# Patient Record
Sex: Male | Born: 1970 | Race: White | Hispanic: No | Marital: Married | Smoking: Never smoker
Health system: Southern US, Community
[De-identification: ages and names within clinical notes are randomized; demographics above are authoritative.]

## PROBLEM LIST (undated history)

## (undated) DIAGNOSIS — M545 Low back pain, unspecified: Secondary | ICD-10-CM

## (undated) DIAGNOSIS — Z8679 Personal history of other diseases of the circulatory system: Secondary | ICD-10-CM

## (undated) DIAGNOSIS — T7840XA Allergy, unspecified, initial encounter: Secondary | ICD-10-CM

## (undated) DIAGNOSIS — Z86010 Personal history of colonic polyps: Secondary | ICD-10-CM

## (undated) DIAGNOSIS — D649 Anemia, unspecified: Secondary | ICD-10-CM

## (undated) DIAGNOSIS — T8859XA Other complications of anesthesia, initial encounter: Secondary | ICD-10-CM

## (undated) DIAGNOSIS — T4145XA Adverse effect of unspecified anesthetic, initial encounter: Secondary | ICD-10-CM

## (undated) DIAGNOSIS — E039 Hypothyroidism, unspecified: Secondary | ICD-10-CM

## (undated) DIAGNOSIS — Z889 Allergy status to unspecified drugs, medicaments and biological substances status: Secondary | ICD-10-CM

## (undated) HISTORY — DX: Allergy, unspecified, initial encounter: T78.40XA

## (undated) HISTORY — PX: VASECTOMY: SHX75

## (undated) HISTORY — PX: COLONOSCOPY: SHX174

## (undated) HISTORY — PX: OTHER SURGICAL HISTORY: SHX169

## (undated) HISTORY — DX: Low back pain: M54.5

## (undated) HISTORY — DX: Personal history of other diseases of the circulatory system: Z86.79

## (undated) HISTORY — DX: Low back pain, unspecified: M54.50

## (undated) HISTORY — PX: ESOPHAGOGASTRODUODENOSCOPY: SHX1529

## (undated) HISTORY — PX: SPINE SURGERY: SHX786

## (undated) HISTORY — DX: Hypothyroidism, unspecified: E03.9

---
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
## 1993-12-08 HISTORY — PX: LASIK: SHX215

## 2015-03-21 ENCOUNTER — Ambulatory Visit
Payer: Federal, State, Local not specified - PPO | Attending: Physical Medicine and Rehabilitation | Admitting: Physical Therapy

## 2015-03-21 ENCOUNTER — Encounter: Payer: Self-pay | Admitting: Physical Therapy

## 2015-03-21 DIAGNOSIS — M545 Low back pain, unspecified: Secondary | ICD-10-CM

## 2015-03-21 NOTE — Patient Instructions (Signed)
Wall Lean Stretch   Keep left arm to side , With left elbow against wall, slowly stretch hips toward wall, right  other arm supporting trunk. Hold _60___ seconds. Relax. Repeat __1__ times per set. Do _1___ sets per session. Every hour Do not rotate trunk  http://orth.exer.us/102   Copyright  VHI. All rights reserved.  Lifting Principles .Maintain proper posture and head alignment. .Slide object as close as possible before lifting. .Move obstacles out of the way. .Test before lifting; ask for help if too heavy. .Tighten stomach muscles without holding breath. .Use smooth movements; do not jerk. .Use legs to do the work, and pivot with feet. .Distribute the work load symmetrically and close to the center of trunk. .Push instead of pull whenever possible.  Copyright  VHI. All rights reserved.  Low Shelf   Squat down, and bring item close to lift.   Copyright  VHI. All rights reserved.  Ask For Help   Ask for help and delegate to others when possible. Coordinate your movements when lifting together, and maintain the low back curve.   Copyright  VHI. All rights reserved.  Sleeping on Side   Place pillow between knees and feet. Use cervical support under neck and a roll around waist as needed.   Copyright  VHI. All rights reserved.  Patient able to return demonstration correctly.

## 2015-03-21 NOTE — Therapy (Signed)
[facility] [address] [place], [place], [address] Phone: [phone]   Fax:  [phone]  Physical Therapy Evaluation  Patient Details  Name: Mario Moore MRN: [id number] Date of Birth: 04-09-1971 Referring Provider:  Normajean Glasgow, MD  Encounter Date: 03/21/2015      PT End of Session - 03/21/15 1610    Visit Number 1   Date for PT Re-Evaluation 05/02/15   PT Start Time 3335   PT Stop Time 1630   PT Time Calculation (min) 60 min   Activity Tolerance Patient tolerated treatment well   Behavior During Therapy [facility] for tasks assessed/performed      History reviewed. No pertinent past medical history.  History reviewed. No pertinent past surgical history.  There were no vitals filed for this visit.  Visit Diagnosis:  Right-sided low back pain without sciatica - Plan: PT plan of care cert/re-cert      Subjective Assessment - 03/21/15 1535    Subjective Patient reports he originally hurt his back in college.  Patient most recent flare-up last friday, 03/16/2015.  Patient reports pain began after a gym work out when he pulled a bag of strawberries out.    Limitations Sitting   How long can you sit comfortably? o min   How long can you stand comfortably? 5 min   How long can you walk comfortably? 5 min   Diagnostic tests MRI 10/2013 showed 2 bulging disc.    Patient Stated Goals stronger back   Currently in Pain? Yes   Pain Score 3   5/10 worse   Pain Location Back   Pain Orientation Right   Pain Descriptors / Indicators Sharp;Dull;Constant   Pain Type Chronic pain   Pain Onset In the past 7 days   Pain Frequency Constant   Aggravating Factors  bending   Pain Relieving Factors lay on back with knees to chest   Effect of Pain on Daily Activities difficulty to work out and doing daily activities   Multiple Pain Sites No            [facility] PT Assessment - 03/21/15 0001    Assessment   Medical Diagnosis  Lower back pain   Onset Date 03/16/15   Prior Therapy None   Precautions   Precautions None   Balance Screen   Has the patient fallen in the past 6 months No   Has the patient had a decrease in activity level because of a fear of falling?  No   Is the patient reluctant to leave their home because of a fear of falling?  No   Prior Function   Level of Independence Independent with basic ADLs   Vocation Full time employment   Vocation Requirements sitting, walking, driving   Observation/Other Assessments   Focus on Therapeutic Outcomes (FOTO)  None   Posture/Postural Control   Posture/Postural Control Postural limitations   Postural Limitations Rounded Shoulders;Forward head;Decreased lumbar lordosis;Flexed trunk   AROM   Lumbar Extension decreased by 75%   Lumbar - Right Side Bend decreased by 25%   Palpation   Palpation Right ilium is anteriorly rotated, sacrum is rotated left, Decreased mobility of T11-L5                   [facility] Adult PT Treatment/Exercise - 03/21/15 0001    Modalities   Modalities Electrical Stimulation;Moist Heat   Moist Heat Therapy   Number Minutes Moist Heat 20 Minutes   Moist Heat  Location --  lumbar   Electrical Stimulation   Electrical Stimulation Location lumbar  supine   Electrical Stimulation Action IFC   Electrical Stimulation Parameters 80-_0    Electrical Stimulation Goals Pain   Manual Therapy   Manual Therapy --  MET to correct right ilium                PT Education - 03/21/15 1609    Education provided Yes   Education Details lateral shift, body mechanics   Person(s) Educated Patient   Methods Explanation;Demonstration;Tactile cues;Verbal cues;Handout   Comprehension Returned demonstration          PT Short Term Goals - 03/21/15 1614    PT SHORT TERM GOAL #1   Title understand correct body mechanics with sitting, lifting and work tasks   Time 3   Period Weeks   Status New   PT SHORT TERM GOAL #2    Title pain with daily activities decreased >/= 25%   Time 3   Period Weeks   Status New   PT SHORT TERM GOAL #3   Title full lumbar ROM due to improved lumbar mobility   Time 3   Period Weeks   Status New           PT Long Term Goals - 03/21/15 1615    PT LONG TERM GOAL #1   Title Pain with daily activities decreased >/= 75%   Time 6   Period Weeks   Status New   PT LONG TERM GOAL #2   Title return to working out due to reduced pain and increased stabiity of trunk   Time 6   Period Weeks   Status New   PT LONG TERM GOAL #3   Title sit in the car at work for extended period of time with minimal to no pain   Time 6   Period Weeks   Status New   PT LONG TERM GOAL #4   Title return to working in the yard with correct body mechanics   Time 6   Period Weeks   Status New               Plan - 03/21/15 1611    Clinical Impression Statement Patient is a 44 year old male with lumbar pain on the right, anteriorly rotated left ilium, sacrum rotated left, decreased mobility of Lumbar spine.    Pt will benefit from skilled therapeutic intervention in order to improve on the following deficits Improper body mechanics;Decreased range of motion;Increased fascial restricitons;Decreased activity tolerance;Increased muscle spasms;Pain;Decreased mobility;Decreased strength   Rehab Potential Excellent   PT Frequency 2x / week   PT Duration 6 weeks   PT Treatment/Interventions Moist Heat;Therapeutic activities;Patient/family education;Therapeutic exercise;Ultrasound;Manual techniques;Neuromuscular re-education;Electrical Stimulation;Functional mobility training;Cryotherapy   PT Next Visit Plan see if patient ready for lumbar extension, lower abdominal training, correct pelvis, lumbar mobilization   PT Home Exercise Plan lower abdominal   Recommended Other Services None   Consulted and Agree with Plan of Care Patient         Problem List There are no active problems to display  for this patient.   GRAY,CHERYL,PT 03/21/2015, 5:22 PM  [facility] [address] [place], [place], [address] Phone: [phone]   Fax:  [phone]

## 2015-03-22 ENCOUNTER — Encounter: Payer: Self-pay | Admitting: Physical Therapy

## 2015-03-22 ENCOUNTER — Ambulatory Visit: Payer: Federal, State, Local not specified - PPO | Admitting: Physical Therapy

## 2015-03-22 DIAGNOSIS — M545 Low back pain, unspecified: Secondary | ICD-10-CM

## 2015-03-22 NOTE — Patient Instructions (Signed)
Abdominal Bracing With Pelvic Floor (Hook-Lying)   With neutral spine, tighten pelvic floor and abdominals. Hold for 5 seconds Repeat _10__ times. Do _2__ times a day.   Copyright  VHI. All rights reserved.  Patient able to return demonstration correctly  With above exercise.

## 2015-03-22 NOTE — Therapy (Signed)
[facility] [address] [place], [place], [address] Phone: [phone]   Fax:  [phone]  Physical Therapy Treatment  Patient Details  Name: Mario Moore MRN: [id number] Date of Birth: November 24, 1971 Referring Provider:  Normajean Glasgow, MD  Encounter Date: 03/22/2015      PT End of Session - 03/22/15 1628    Visit Number 2   Date for PT Re-Evaluation 05/02/15   PT Start Time 0539   PT Stop Time 1630   PT Time Calculation (min) 60 min   Activity Tolerance Patient tolerated treatment well   Behavior During Therapy [facility] for tasks assessed/performed      History reviewed. No pertinent past medical history.  History reviewed. No pertinent past surgical history.  There were no vitals filed for this visit.  Visit Diagnosis:  Right-sided low back pain without sciatica      Subjective Assessment - 03/22/15 1543    Subjective I got a home TENS unit.  I try not to take the pain medication. I feel the pain may be coming to the right .    Limitations Sitting   How long can you sit comfortably? o min   How long can you stand comfortably? 5 min   How long can you walk comfortably? 5 min   Diagnostic tests MRI 10/2013 showed 2 bulging disc.    Patient Stated Goals stronger back   Currently in Pain? Yes   Pain Score 4    Pain Location Back   Pain Orientation Right   Pain Descriptors / Indicators Sharp;Dull;Constant   Pain Type Chronic pain   Pain Onset In the past 7 days   Pain Frequency Constant   Aggravating Factors  bending   Pain Relieving Factors lay on back with knees to chest   Effect of Pain on Daily Activities difficulty to work out and doing daily activities   Multiple Pain Sites No                       [facility] Adult PT Treatment/Exercise - 03/22/15 0001    Posture/Postural Control   Posture Comments PT verbally instructed patient on how to go from sit to stand with abdominal bracing, go from  supine to sitting with abdominal bracing   Lumbar Exercises: Aerobic   Stationary Bike L2 x 8 min   Lumbar Exercises: Standing   Other Standing Lumbar Exercises standing lateral shift to the left to centralize the pain   Lumbar Exercises: Supine   Ab Set 10 reps;5 seconds   AB Set Limitations Patient needed to breathe through his fist to contract lower abdominal    Moist Heat Therapy   Number Minutes Moist Heat 20 Minutes   Moist Heat Location --  lumbar   Electrical Stimulation   Electrical Stimulation Location lumbar  supine   Electrical Stimulation Action IFC   Electrical Stimulation Parameters 80-150hz    Electrical Stimulation Goals Pain   Manual Therapy   Manual Therapy Joint mobilization;Massage   Joint Mobilization side glide to L2-L3   Massage soft tissue work to right lumbar paraspinals, right quadratus, right gluteals                PT Education - 03/22/15 1628    Education provided Yes   Education Details abdominal bracing, body mechanics with sit to stand and getting out of bed.    Person(s) Educated Patient   Methods Explanation;Demonstration;Tactile cues;Verbal cues;Handout   Comprehension Verbalized understanding;Returned  demonstration          PT Short Term Goals - 03/21/15 1614    PT SHORT TERM GOAL #1   Title understand correct body mechanics with sitting, lifting and work tasks   Time 3   Period Weeks   Status New   PT SHORT TERM GOAL #2   Title pain with daily activities decreased >/= 25%   Time 3   Period Weeks   Status New   PT SHORT TERM GOAL #3   Title full lumbar ROM due to improved lumbar mobility   Time 3   Period Weeks   Status New           PT Long Term Goals - 03/21/15 1615    PT LONG TERM GOAL #1   Title Pain with daily activities decreased >/= 75%   Time 6   Period Weeks   Status New   PT LONG TERM GOAL #2   Title return to working out due to reduced pain and increased stabiity of trunk   Time 6   Period Weeks    Status New   PT LONG TERM GOAL #3   Title sit in the car at work for extended period of time with minimal to no pain   Time 6   Period Weeks   Status New   PT LONG TERM GOAL #4   Title return to working in the yard with correct body mechanics   Time 6   Period Weeks   Status New               Plan - 03/22/15 1629    Clinical Impression Statement Patient had swelling on the right lumbar region that decreased after ultrasound and soft tissue work. Patient requires verbal cues to brace his abdominal with getting of the mat.  Patient will protrude his abdomen instead of contraction lower abdominals with abdominal bracing.    Pt will benefit from skilled therapeutic intervention in order to improve on the following deficits Improper body mechanics;Decreased range of motion;Increased fascial restricitons;Decreased activity tolerance;Increased muscle spasms;Pain;Decreased mobility;Decreased strength   Rehab Potential Excellent   PT Frequency 2x / week   PT Duration 6 weeks   PT Treatment/Interventions Moist Heat;Therapeutic activities;Patient/family education;Therapeutic exercise;Ultrasound;Manual techniques;Neuromuscular re-education;Electrical Stimulation;Functional mobility training;Cryotherapy   PT Next Visit Plan lower abdominal contraction with hip in and out, continue with soft tissue work,  prone laying to increase lumbar extension   PT Home Exercise Plan lower abdominal    Recommended Other Services None   Consulted and Agree with Plan of Care Patient        Problem List There are no active problems to display for this patient.   GRAY,CHERYL,PT 03/22/2015, 4:33 PM  [facility] [address] [place], [place], [address] Phone: [phone]   Fax:  [phone]

## 2015-03-27 ENCOUNTER — Ambulatory Visit: Payer: Federal, State, Local not specified - PPO | Admitting: Physical Therapy

## 2015-03-27 ENCOUNTER — Encounter: Payer: Self-pay | Admitting: Physical Therapy

## 2015-03-27 DIAGNOSIS — M545 Low back pain, unspecified: Secondary | ICD-10-CM

## 2015-03-27 NOTE — Therapy (Signed)
[facility] [address], [place], [place], [address] Phone: [phone]   Fax:  [phone]  Physical Therapy Treatment  Patient Details  Name: Mario Moore MRN: [id number] Date of Birth: 1971-01-19 Referring Provider:  Normajean Glasgow, MD  Encounter Date: 03/27/2015      PT End of Session - 03/27/15 0942    Visit Number 3   Date for PT Re-Evaluation 05/02/15   PT Start Time 0930   PT Stop Time 0932   PT Time Calculation (min) 45 min   Activity Tolerance Patient tolerated treatment well   Behavior During Therapy [facility] for tasks assessed/performed      History reviewed. No pertinent past medical history.  History reviewed. No pertinent past surgical history.  There were no vitals filed for this visit.  Visit Diagnosis:  Right-sided low back pain without sciatica      Subjective Assessment - 03/27/15 0937    Subjective Pt reports 95% better since last PT visit   Limitations Sitting   How long can you sit comfortably? getting up from sitting causes some discomfort   How long can you stand comfortably? unlimited   Patient Stated Goals stronger back   Currently in Pain? No/denies   Multiple Pain Sites No                         [facility] Adult PT Treatment/Exercise - 03/27/15 0001    Lumbar Exercises: Aerobic   Stationary Bike L4 x 8 min   Lumbar Exercises: Standing   Other Standing Lumbar Exercises standing lateral shift to the left to centralize the pain  with prolonged hold, and dynamic 10 reps to Lt side   Lumbar Exercises: Supine   Ab Set 10 reps;5 seconds   Modalities   Modalities Electrical Stimulation;Ultrasound   Moist Heat Therapy   Number Minutes Moist Heat 20 Minutes   Moist Heat Location --  lumbar   Electrical Stimulation   Electrical Stimulation Location lumbar   Electrical Stimulation Action IFC   Electrical Stimulation Parameters 80-150Hz    Electrical Stimulation Goals Pain    Ultrasound   Ultrasound Location Lumbar  Rt lumbar   Ultrasound Parameters 100%, 1 Mhz, 1.2 W/cm x 6 min   Ultrasound Goals Edema;Pain   Manual Therapy   Manual Therapy --   Massage --                  PT Short Term Goals - 03/27/15 0955    PT SHORT TERM GOAL #1   Title understand correct body mechanics with sitting, lifting and work tasks   Time 3   Period Weeks   Status On-going   PT SHORT TERM GOAL #2   Title pain with daily activities decreased >/= 25%   Time 3   Period Weeks   Status On-going   PT SHORT TERM GOAL #3   Title full lumbar ROM due to improved lumbar mobility   Time 3   Period Weeks   Status On-going           PT Long Term Goals - 03/27/15 6712    PT LONG TERM GOAL #1   Title Pain with daily activities decreased >/= 75%   Time 6   Period Weeks   Status Achieved   PT LONG TERM GOAL #2   Title return to working out due to reduced pain and increased stabiity of trunk   Time 6   Period  Weeks   Status On-going   PT LONG TERM GOAL #3   Title sit in the car at work for extended period of time with minimal to no pain   Time 6   Period Weeks   Status On-going   PT LONG TERM GOAL #4   Title return to working in the yard with correct body mechanics   Time 6   Period Weeks   Status On-going               Plan - 03/27/15 0951    Clinical Impression Statement Pt with good demonstration of abdominal activation and reports compliance with HEP   Rehab Potential Excellent   PT Frequency 2x / week   PT Duration 6 weeks   PT Next Visit Plan lower abdominal contraction with hip in and out, continue with soft tissue work,  prone laying to increase lumbar extension   PT Home Exercise Plan continue with lower abdominal    Recommended Other Services none   Consulted and Agree with Plan of Care Patient        Problem List There are no active problems to display for this patient.   NAUMANN-HOUEGNIFIO,Fidencia Mccloud PTA 03/27/2015, 1:46  PM  [facility] [address] [place], [place], [address] Phone: [phone]   Fax:  [phone]

## 2015-03-30 ENCOUNTER — Ambulatory Visit: Payer: Federal, State, Local not specified - PPO | Admitting: Physical Therapy

## 2015-03-30 ENCOUNTER — Encounter: Payer: Self-pay | Admitting: Physical Therapy

## 2015-03-30 DIAGNOSIS — M545 Low back pain, unspecified: Secondary | ICD-10-CM

## 2015-03-30 NOTE — Patient Instructions (Signed)
Posture Tips DO: - stand tall and erect - keep chin tucked in - keep head and shoulders in alignment - check posture regularly in mirror or large window - pull head back against headrest in car seat;  Change your position often.  Sit with lumbar support. DON'T: - slouch or slump while watching TV or reading - sit, stand or lie in one position  for too long;  Sitting is especially hard on the spine so if you sit at a desk/use the computer, then stand up often!   Copyright  VHI. All rights reserved.  Posture - Standing   Good posture is important. Avoid slouching and forward head thrust. Maintain curve in low back and align ears over shoul- ders, hips over ankles.  Pull your belly button in toward your back bone.   Copyright  VHI. All rights reserved.  Posture - Sitting   Sit upright, head facing forward. Try using a roll to support lower back. Keep shoulders relaxed, and avoid rounded back. Keep hips level with knees. Avoid crossing legs for long periods.   Copyright  VHI. All rights reserved.  Lower abdominal/core stability exercises  1. Practice your breathing technique: Inhale through your nose expanding your belly and rib cage. Try not to breathe into your chest. Exhale slowly and gradually out your mouth feeling a sense of softness to your body. Practice multiple times. This can be performed unlimited.  2. Finding the lower abdominals. Laying on your back with the knees bent, place your fingers just below your belly button. Using your breathing technique from above, on your exhale gently pull the belly button away from your fingertips without tensing any other muscles. Practice this 5x. Next, as you exhale, draw belly button inwards and hold onto it...then feel as if you are pulling that muscle across your pelvis like you are tightening a belt. This can be hard to do at first so be patient and practice. Do 5-10 reps 1-3 x day. Always recognize quality over quantity; if your abdominal  muscles become tired you will notice you may tighten/contract other muscles. This is the time to take a break.   Practice this first laying on your back, then in sitting, progressing to standing and finally adding it to all your daily movements.   3. Finding your pelvic floor. Using the breathing technique above, when your exhale, this time draw your pelvic floor muscles up as if you were attempting to stop the flow of urination. Be careful NOT to tense any other muscles. This can be hard, BE PATIENT. Try to hold up to 10 seconds repeating 10x. Try 2x a day. Once you feel you are doing this well, add this contraction to exercise #2. First contracting your pelvic floor followed by lower abdominals.  4. Adding leg movements. Add the following leg movements to challenge your ability to keep your core stable:  1. Single leg drop outs: Laying on your back with knees bent feet flat. Inhale,  dropping one knee outward KEEPING YOUR PELVIS STILL. Exhale as you bring the leg back, simultaneously performing your lower abdominal contraction. Do 5-10 on each leg.  2. Marching: While keeping your pelvis still, lift the right foot a few inches, put it down then lift left foot. This will mimic a march. Start slow to establish control. Once you have control you may speed it up. Do 10-20x. You MUST keep your lower abdominlas contracted while you march. Breathe naturally   3. Single leg slides: Inhale while  you slowly slide one leg out keeping your pelvis still. Only slide your leg as far as you can keep your pelvis still. Exhale as you bring the leg back to the start, contracting the lower abdominals as you do that. Keep your upper body relaxed. Do 5-10 on each side.    Repeat __5-10__ times per set.  Do _1-2___ sessions per day. Child Pose   Sitting on knees, fold body over legs and relax head and arms on floor. Hold for 4___ breaths.  Do 3 reps.  1-2 times a day. Two pillows under your buttocks might be helpful  to really relax into the stretch.   Combination (Quadruped)  On hands and knees with towel roll between knees, slowly inhale, and then exhale. Pull navel toward spine, squeeze roll with knees, and tighten pelvic floor. Hold for __3_ seconds. Rest for _2__ seconds. Repeat _5__ times. Do __many_ times a day.   Bracing With Arm / Leg Raise (Quadruped)  On hands and knees find neutral spine. Tighten pelvic floor and abdominals and hold. Alternating, lift arm to shoulder level and opposite leg to hip level. Repeat _5__ times. Do _3__ times a day. Hold for 3 seconds. Keep deepening the core contraction.        Marland Kitchen

## 2015-03-30 NOTE — Therapy (Addendum)
[facility] [address], [place], [place], [address] Phone: [phone]   Fax:  [phone]  Physical Therapy Treatment  Patient Details  Name: Mario Moore MRN: [id number] Date of Birth: 02-Mar-1971 Referring Provider:  Normajean Glasgow, MD  Encounter Date: 03/30/2015      PT End of Session - 03/30/15 1043    Visit Number 4   Date for PT Re-Evaluation 05/02/15   PT Start Time 6962   PT Stop Time 1115   PT Time Calculation (min) 61 min   Activity Tolerance Patient tolerated treatment well   Behavior During Therapy [facility] for tasks assessed/performed      History reviewed. No pertinent past medical history.  History reviewed. No pertinent past surgical history.  There were no vitals filed for this visit.  Visit Diagnosis:  Right-sided low back pain without sciatica      Subjective Assessment - 03/30/15 1015    Subjective Continues to slowly improve, just slower than he would like.    Currently in Pain? Yes   Pain Score 1    Pain Orientation Right   Pain Descriptors / Indicators Sharp;Dull   Aggravating Factors  Bending    Pain Relieving Factors Not bending   Multiple Pain Sites No                         [facility] Adult PT Treatment/Exercise - 03/30/15 0001    Lumbar Exercises: Stretches   Active Hamstring Stretch 3 reps;20 seconds   Prone on Elbows Stretch --  2 min   Press Ups 3 reps   Quadruped Mid Back Stretch 2 reps;10 seconds   Lumbar Exercises: Supine   Ab Set --  TA contraction x10   Other Supine Lumbar Exercises TA series   Lumbar Exercises: Prone   Other Prone Lumbar Exercises TA activation 10x   Lumbar Exercises: Quadruped   Opposite Arm/Leg Raise Limitations 5x each side   Moist Heat Therapy   Number Minutes Moist Heat 15 Minutes   Moist Heat Location --  lumbar   Electrical Stimulation   Electrical Stimulation Location Lumbar   Electrical Stimulation Action IFC   Electrical  Stimulation Parameters 80-'150HZ'    Electrical Stimulation Goals Pain                PT Education - 03/30/15 1037    Education Details HEP and lumbar protective ADLS/tips to avoid compression.   Person(s) Educated Patient   Methods Explanation;Demonstration;Tactile cues;Verbal cues;Handout   Comprehension Verbalized understanding;Returned demonstration          PT Short Term Goals - 03/27/15 0955    PT SHORT TERM GOAL #1   Title understand correct body mechanics with sitting, lifting and work tasks   Time 3   Period Weeks   Status On-going   PT SHORT TERM GOAL #2   Title pain with daily activities decreased >/= 25%   Time 3   Period Weeks   Status On-going   PT SHORT TERM GOAL #3   Title full lumbar ROM due to improved lumbar mobility   Time 3   Period Weeks   Status On-going           PT Long Term Goals - 03/27/15 9528    PT LONG TERM GOAL #1   Title Pain with daily activities decreased >/= 75%   Time 6   Period Weeks   Status Achieved   PT LONG TERM GOAL #  2   Title return to working out due to reduced pain and increased stabiity of trunk   Time 6   Period Weeks   Status On-going   PT LONG TERM GOAL #3   Title sit in the car at work for extended period of time with minimal to no pain   Time 6   Period Weeks   Status On-going   PT LONG TERM GOAL #4   Title 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    Pt will benefit from skilled therapeutic intervention in order to improve on the following deficits Improper body mechanics;Decreased range of motion;Increased fascial restricitons;Decreased activity tolerance;Increased muscle spasms;Pain;Decreased mobility;Decreased strength   Rehab Potential Excellent   PT Frequency 2x / week   PT Treatment/Interventions Moist  Heat;Therapeutic activities;Patient/family education;Therapeutic exercise;Ultrasound;Manual techniques;Neuromuscular re-education;Electrical Stimulation;Functional mobility training;Cryotherapy   PT Next Visit Plan Out of town next week. Review HEP given.   Consulted and Agree with Plan of Care Patient        Problem List There are no active problems to display for this patient.   Hank Walling, PTA 03/30/2015, 10:55 AM  [facility] [address], [place], [place], [address] Phone: [phone]   Fax:  [phone]     PHYSICAL THERAPY DISCHARGE SUMMARY  Visits from Start of Care: 4  Current functional level related to goals / functional outcomes: See above. Unable to reassess patient for discharge due to not returning to physical therapy.    Remaining deficits: See above   Education / Equipment: HEP Plan:                                                    Patient goals were partially met. Patient is being discharged due to not returning since the last visit.  Thank you for referral. Earlie Counts, PT 10/24/2015 3:13 PM  ?????

## 2015-12-20 ENCOUNTER — Ambulatory Visit: Payer: Self-pay | Admitting: Surgical

## 2015-12-20 NOTE — Progress Notes (Signed)
Preoperative surgical orders have been place into the Epic hospital system for Mario Moore on 12/20/2015, 10:24 AM  by Mickel Crow for surgery on 01-09-2016.  Preop Knee Scope orders including IV Tylenol and IV Decadron as long as there are no contraindications to the above medications. Arlee Muslim, PA-C

## 2016-01-04 NOTE — Patient Instructions (Addendum)
Mario Moore  01/04/2016   Your procedure is scheduled on: 01-09-16 Wednesday  Report to [facility] Main  Entrance take [facility]  elevators to 3rd floor to  [place] at   0800 AM.  Call this number if you have problems the morning of surgery [phone]   Remember: ONLY 1 PERSON MAY GO WITH YOU TO SHORT STAY TO GET  READY MORNING OF [place].  Do not eat food or drink liquids :After Midnight.     Take these medicines the morning of surgery with A SIP OF WATER: Cetirizine(Zyrtec). DO NOT TAKE ANY DIABETIC MEDICATIONS DAY OF YOUR SURGERY                               You may not have any metal on your body including hair pins and              piercings  Do not wear jewelry, make-up, lotions, powders or perfumes, deodorant             Do not wear nail polish.  Do not shave  48 hours prior to surgery.              Men may shave face and neck.   Do not bring valuables to the hospital. [place].  Contacts, dentures or bridgework may not be worn into surgery.  Leave suitcase in the car. After surgery it may be brought to your room.     Patients discharged the day of surgery will not be allowed to drive home.  Name and phone number of your driver:"Penn" -spouse [phone] cell  Special Instructions: N/A              Please read over the following fact sheets you were given: _____________________________________________________________________             [facility] - Preparing for Surgery Before surgery, you can play an important role.  Because skin is not sterile, your skin needs to be as free of germs as possible.  You can reduce the number of germs on your skin by washing with CHG (chlorahexidine gluconate) soap before surgery.  CHG is an antiseptic cleaner which kills germs and bonds with the skin to continue killing germs even after washing. Please DO NOT use if you have an allergy to CHG or  antibacterial soaps.  If your skin becomes reddened/irritated stop using the CHG and inform your nurse when you arrive at Short Stay. Do not shave (including legs and underarms) for at least 48 hours prior to the first CHG shower.  You may shave your face/neck. Please follow these instructions carefully:  1.  Shower with CHG Soap the night before surgery and the  morning of Surgery.  2.  If you choose to wash your hair, wash your hair first as usual with your  normal  shampoo.  3.  After you shampoo, rinse your hair and body thoroughly to remove the  shampoo.                           4.  Use CHG as you would any other liquid soap.  You can apply chg directly  to  the skin and wash                       Gently with a scrungie or clean washcloth.  5.  Apply the CHG Soap to your body ONLY FROM THE NECK DOWN.   Do not use on face/ open                           Wound or open sores. Avoid contact with eyes, ears mouth and genitals (private parts).                       Wash face,  Genitals (private parts) with your normal soap.             6.  Wash thoroughly, paying special attention to the area where your surgery  will be performed.  7.  Thoroughly rinse your body with warm water from the neck down.  8.  DO NOT shower/wash with your normal soap after using and rinsing off  the CHG Soap.                9.  Pat yourself dry with a clean towel.            10.  Wear clean pajamas.            11.  Place clean sheets on your bed the night of your first shower and do not  sleep with pets. Day of Surgery : Do not apply any lotions/deodorants the morning of surgery.  Please wear clean clothes to the hospital/surgery center.  FAILURE TO FOLLOW THESE INSTRUCTIONS MAY RESULT IN THE CANCELLATION OF YOUR SURGERY PATIENT SIGNATURE_________________________________  NURSE SIGNATURE__________________________________  ________________________________________________________________________   Adam Phenix  An incentive spirometer is a tool that can help keep your lungs clear and active. This tool measures how well you are filling your lungs with each breath. Taking long deep breaths may help reverse or decrease the chance of developing breathing (pulmonary) problems (especially infection) following:  A long period of time when you are unable to move or be active. BEFORE THE PROCEDURE   If the spirometer includes an indicator to show your best effort, your nurse or respiratory therapist will set it to a desired goal.  If possible, sit up straight or lean slightly forward. Try not to slouch.  Hold the incentive spirometer in an upright position. INSTRUCTIONS FOR USE  1. Sit on the edge of your bed if possible, or sit up as far as you can in bed or on a chair. 2. Hold the incentive spirometer in an upright position. 3. Breathe out normally. 4. Place the mouthpiece in your mouth and seal your lips tightly around it. 5. Breathe in slowly and as deeply as possible, raising the piston or the ball toward the top of the column. 6. Hold your breath for 3-5 seconds or for as long as possible. Allow the piston or ball to fall to the bottom of the column. 7. Remove the mouthpiece from your mouth and breathe out normally. 8. Rest for a few seconds and repeat Steps 1 through 7 at least 10 times every 1-2 hours when you are awake. Take your time and take a few normal breaths between deep breaths. 9. The spirometer may include an indicator to show your best effort. Use the indicator as a goal to work toward during each repetition. 10. After each set  of 10 deep breaths, practice coughing to be sure your lungs are clear. If you have an incision (the cut made at the time of surgery), support your incision when coughing by placing a pillow or rolled up towels firmly against it. Once you are able to get out of bed, walk around indoors and cough well. You may stop using the incentive spirometer when  instructed by your caregiver.  RISKS AND COMPLICATIONS  Take your time so you do not get dizzy or light-headed.  If you are in pain, you may need to take or ask for pain medication before doing incentive spirometry. It is harder to take a deep breath if you are having pain. AFTER USE  Rest and breathe slowly and easily.  It can be helpful to keep track of a log of your progress. Your caregiver can provide you with a simple table to help with this. If you are using the spirometer at home, follow these instructions: [place] IF:   You are having difficultly using the spirometer.  You have trouble using the spirometer as often as instructed.  Your pain medication is not giving enough relief while using the spirometer.  You develop fever of 100.5 F (38.1 C) or higher. SEEK IMMEDIATE MEDICAL CARE IF:   You cough up bloody sputum that had not been present before.  You develop fever of 102 F (38.9 C) or greater.  You develop worsening pain at or near the incision site. MAKE SURE YOU:   Understand these instructions.  Will watch your condition.  Will get help right away if you are not doing well or get worse. Document Released: 04/06/2007 Document Revised: 02/16/2012 Document Reviewed: 06/07/2007 [facility] Patient Information 2014 [place], [place].   ________________________________________________________________________

## 2016-01-07 ENCOUNTER — Encounter (HOSPITAL_COMMUNITY)
Admission: RE | Admit: 2016-01-07 | Discharge: 2016-01-07 | Disposition: A | Discharge status: Home / Self Care | Payer: Federal, State, Local not specified - PPO | Source: Ambulatory Visit | Attending: Orthopedic Surgery | Admitting: Orthopedic Surgery

## 2016-01-07 ENCOUNTER — Encounter (HOSPITAL_COMMUNITY): Payer: Self-pay

## 2016-01-07 DIAGNOSIS — X58XXXA Exposure to other specified factors, initial encounter: Secondary | ICD-10-CM | POA: Diagnosis not present

## 2016-01-07 DIAGNOSIS — M25562 Pain in left knee: Secondary | ICD-10-CM | POA: Diagnosis present

## 2016-01-07 DIAGNOSIS — S83242A Other tear of medial meniscus, current injury, left knee, initial encounter: Secondary | ICD-10-CM | POA: Diagnosis not present

## 2016-01-07 HISTORY — DX: Allergy status to unspecified drugs, medicaments and biological substances: Z88.9

## 2016-01-07 HISTORY — DX: Anemia, unspecified: D64.9

## 2016-01-07 LAB — CBC
HCT: 45.5 % (ref 39.0–52.0)
Hemoglobin: 15.4 g/dL (ref 13.0–17.0)
MCH: 30.7 pg (ref 26.0–34.0)
MCHC: 33.8 g/dL (ref 30.0–36.0)
MCV: 90.8 fL (ref 78.0–100.0)
PLATELETS: 218 10*3/uL (ref 150–400)
RBC: 5.01 MIL/uL (ref 4.22–5.81)
RDW: 13.2 % (ref 11.5–15.5)
WBC: 3.6 10*3/uL — AB (ref 4.0–10.5)

## 2016-01-09 ENCOUNTER — Encounter (HOSPITAL_COMMUNITY): Payer: Self-pay | Admitting: *Deleted

## 2016-01-09 ENCOUNTER — Ambulatory Visit (HOSPITAL_COMMUNITY): Payer: Federal, State, Local not specified - PPO | Admitting: Registered Nurse

## 2016-01-09 ENCOUNTER — Ambulatory Visit (HOSPITAL_COMMUNITY)
Admission: RE | Admit: 2016-01-09 | Discharge: 2016-01-09 | Disposition: A | Discharge status: Home / Self Care | Payer: Federal, State, Local not specified - PPO | Source: Ambulatory Visit | Attending: Orthopedic Surgery | Admitting: Orthopedic Surgery

## 2016-01-09 ENCOUNTER — Encounter (HOSPITAL_COMMUNITY)
Admission: RE | Disposition: A | Discharge status: Home / Self Care | Payer: Self-pay | Source: Ambulatory Visit | Attending: Orthopedic Surgery

## 2016-01-09 DIAGNOSIS — X58XXXA Exposure to other specified factors, initial encounter: Secondary | ICD-10-CM | POA: Insufficient documentation

## 2016-01-09 DIAGNOSIS — S83242A Other tear of medial meniscus, current injury, left knee, initial encounter: Secondary | ICD-10-CM | POA: Insufficient documentation

## 2016-01-09 HISTORY — DX: Other complications of anesthesia, initial encounter: T88.59XA

## 2016-01-09 HISTORY — PX: KNEE ARTHROSCOPY WITH MEDIAL MENISECTOMY: SHX5651

## 2016-01-09 HISTORY — DX: Adverse effect of unspecified anesthetic, initial encounter: T41.45XA

## 2016-01-09 SURGERY — ARTHROSCOPY, KNEE, WITH MEDIAL MENISCECTOMY
Anesthesia: General | Site: Knee | Laterality: Left

## 2016-01-09 MED ORDER — GLYCOPYRROLATE 0.2 MG/ML IJ SOLN
INTRAMUSCULAR | Status: DC | PRN
  Administered 2016-01-09: 0.2 mg via INTRAVENOUS

## 2016-01-09 MED ORDER — HYDROMORPHONE HCL 1 MG/ML IJ SOLN: 0.2500 mg | INTRAMUSCULAR | Status: DC | PRN

## 2016-01-09 MED ORDER — LACTATED RINGERS IV SOLN
INTRAVENOUS | Status: DC
  Administered 2016-01-09: 1000 mL via INTRAVENOUS

## 2016-01-09 MED ORDER — METHOCARBAMOL 500 MG PO TABS: 500.0000 mg | ORAL_TABLET | Freq: Four times a day (QID) | ORAL | Status: DC

## 2016-01-09 MED ORDER — BUPIVACAINE-EPINEPHRINE 0.25% -1:200000 IJ SOLN
INTRAMUSCULAR | Status: DC | PRN
  Administered 2016-01-09: 20 mL

## 2016-01-09 MED ORDER — DEXAMETHASONE SODIUM PHOSPHATE 10 MG/ML IJ SOLN
INTRAMUSCULAR | Status: AC
  Filled 2016-01-09: qty 1

## 2016-01-09 MED ORDER — CEFAZOLIN SODIUM-DEXTROSE 2-3 GM-% IV SOLR
2.0000 g | INTRAVENOUS | Status: AC
  Administered 2016-01-09: 2 g via INTRAVENOUS

## 2016-01-09 MED ORDER — HYDROCODONE-ACETAMINOPHEN 5-325 MG PO TABS
1.0000 | ORAL_TABLET | ORAL | Status: DC | PRN
  Administered 2016-01-09: 1 via ORAL
  Filled 2016-01-09: qty 1

## 2016-01-09 MED ORDER — ONDANSETRON HCL 4 MG/2ML IJ SOLN
INTRAMUSCULAR | Status: AC
  Filled 2016-01-09: qty 2

## 2016-01-09 MED ORDER — CHLORHEXIDINE GLUCONATE 4 % EX LIQD: 60.0000 mL | Freq: Once | CUTANEOUS | Status: DC

## 2016-01-09 MED ORDER — LACTATED RINGERS IR SOLN
Status: DC | PRN
  Administered 2016-01-09 (×3): 3000 mL

## 2016-01-09 MED ORDER — SODIUM CHLORIDE 0.9 % IV SOLN: INTRAVENOUS | Status: DC

## 2016-01-09 MED ORDER — DEXAMETHASONE SODIUM PHOSPHATE 10 MG/ML IJ SOLN
INTRAMUSCULAR | Status: DC | PRN
  Administered 2016-01-09: 10 mg via INTRAVENOUS

## 2016-01-09 MED ORDER — LIDOCAINE HCL (CARDIAC) 20 MG/ML IV SOLN
INTRAVENOUS | Status: AC
  Filled 2016-01-09: qty 5

## 2016-01-09 MED ORDER — GLYCOPYRROLATE 0.2 MG/ML IJ SOLN
INTRAMUSCULAR | Status: AC
  Filled 2016-01-09: qty 1

## 2016-01-09 MED ORDER — MIDAZOLAM HCL 2 MG/2ML IJ SOLN
INTRAMUSCULAR | Status: AC
  Filled 2016-01-09: qty 2

## 2016-01-09 MED ORDER — DEXAMETHASONE SODIUM PHOSPHATE 10 MG/ML IJ SOLN: 10.0000 mg | Freq: Once | INTRAMUSCULAR | Status: DC

## 2016-01-09 MED ORDER — ONDANSETRON HCL 4 MG/2ML IJ SOLN: 4.0000 mg | Freq: Once | INTRAMUSCULAR | Status: DC | PRN

## 2016-01-09 MED ORDER — HYDROCODONE-ACETAMINOPHEN 5-325 MG PO TABS: 1.0000 | ORAL_TABLET | ORAL | Status: DC | PRN

## 2016-01-09 MED ORDER — FENTANYL CITRATE (PF) 100 MCG/2ML IJ SOLN
25.0000 ug | INTRAMUSCULAR | Status: DC | PRN
  Administered 2016-01-09 (×2): 50 ug via INTRAVENOUS

## 2016-01-09 MED ORDER — LIDOCAINE HCL (CARDIAC) 20 MG/ML IV SOLN
INTRAVENOUS | Status: DC | PRN
  Administered 2016-01-09: 100 mg via INTRAVENOUS

## 2016-01-09 MED ORDER — PROPOFOL 10 MG/ML IV BOLUS
INTRAVENOUS | Status: AC
  Filled 2016-01-09: qty 20

## 2016-01-09 MED ORDER — MIDAZOLAM HCL 5 MG/5ML IJ SOLN
INTRAMUSCULAR | Status: DC | PRN
  Administered 2016-01-09: 2 mg via INTRAVENOUS

## 2016-01-09 MED ORDER — ACETAMINOPHEN 10 MG/ML IV SOLN
1000.0000 mg | Freq: Once | INTRAVENOUS | Status: AC
  Administered 2016-01-09: 1000 mg via INTRAVENOUS
  Filled 2016-01-09: qty 100

## 2016-01-09 MED ORDER — FENTANYL CITRATE (PF) 100 MCG/2ML IJ SOLN
INTRAMUSCULAR | Status: DC
Start: 2016-01-09 — End: 2016-01-09
  Filled 2016-01-09: qty 2

## 2016-01-09 MED ORDER — PROPOFOL 10 MG/ML IV BOLUS
INTRAVENOUS | Status: DC | PRN
  Administered 2016-01-09: 200 mg via INTRAVENOUS

## 2016-01-09 MED ORDER — FENTANYL CITRATE (PF) 250 MCG/5ML IJ SOLN
INTRAMUSCULAR | Status: AC
  Filled 2016-01-09: qty 5

## 2016-01-09 MED ORDER — CEFAZOLIN SODIUM-DEXTROSE 2-3 GM-% IV SOLR
INTRAVENOUS | Status: AC
  Filled 2016-01-09: qty 50

## 2016-01-09 MED ORDER — BUPIVACAINE-EPINEPHRINE (PF) 0.25% -1:200000 IJ SOLN
INTRAMUSCULAR | Status: AC
  Filled 2016-01-09: qty 30

## 2016-01-09 MED ORDER — ONDANSETRON HCL 4 MG/2ML IJ SOLN
INTRAMUSCULAR | Status: DC | PRN
  Administered 2016-01-09: 4 mg via INTRAVENOUS

## 2016-01-09 MED ORDER — FENTANYL CITRATE (PF) 100 MCG/2ML IJ SOLN
INTRAMUSCULAR | Status: DC | PRN
  Administered 2016-01-09: 50 ug via INTRAVENOUS
  Administered 2016-01-09: 25 ug via INTRAVENOUS
  Administered 2016-01-09: 50 ug via INTRAVENOUS

## 2016-01-09 MED ORDER — ACETAMINOPHEN 10 MG/ML IV SOLN
INTRAVENOUS | Status: AC
  Filled 2016-01-09: qty 100

## 2016-01-09 SURGICAL SUPPLY — 24 items
BANDAGE ACE 6X5 VEL STRL LF (GAUZE/BANDAGES/DRESSINGS) ×2 IMPLANT
BLADE 4.2CUDA (BLADE) ×2 IMPLANT
CUFF TOURN SGL QUICK 34 (TOURNIQUET CUFF) ×1
CUFF TRNQT CYL 34X4X40X1 (TOURNIQUET CUFF) ×1 IMPLANT
DRAPE U-SHAPE 47X51 STRL (DRAPES) ×2 IMPLANT
DRSG EMULSION OIL 3X3 NADH (GAUZE/BANDAGES/DRESSINGS) ×2 IMPLANT
DRSG PAD ABDOMINAL 8X10 ST (GAUZE/BANDAGES/DRESSINGS) ×2 IMPLANT
DURAPREP 26ML APPLICATOR (WOUND CARE) ×2 IMPLANT
GAUZE SPONGE 4X4 12PLY STRL (GAUZE/BANDAGES/DRESSINGS) ×2 IMPLANT
GLOVE BIO SURGEON STRL SZ8 (GLOVE) ×2 IMPLANT
GLOVE BIOGEL PI IND STRL 8 (GLOVE) ×1 IMPLANT
GLOVE BIOGEL PI INDICATOR 8 (GLOVE) ×1
GOWN STRL REUS W/TWL LRG LVL3 (GOWN DISPOSABLE) ×4 IMPLANT
KIT BASIN OR (CUSTOM PROCEDURE TRAY) ×2 IMPLANT
MANIFOLD NEPTUNE II (INSTRUMENTS) ×2 IMPLANT
PACK ARTHROSCOPY WL (CUSTOM PROCEDURE TRAY) ×2 IMPLANT
PACK ICE MAXI GEL EZY WRAP (MISCELLANEOUS) ×2 IMPLANT
PADDING CAST COTTON 6X4 STRL (CAST SUPPLIES) ×2 IMPLANT
POSITIONER SURGICAL ARM (MISCELLANEOUS) ×2 IMPLANT
SUT ETHILON 4 0 PS 2 18 (SUTURE) ×2 IMPLANT
TOWEL OR 17X26 10 PK STRL BLUE (TOWEL DISPOSABLE) ×2 IMPLANT
TUBING ARTHRO INFLOW-ONLY STRL (TUBING) ×2 IMPLANT
WAND HAND CNTRL MULTIVAC 90 (MISCELLANEOUS) ×2 IMPLANT
WRAP KNEE MAXI GEL POST OP (GAUZE/BANDAGES/DRESSINGS) ×2 IMPLANT

## 2016-01-09 NOTE — Interval H&P Note (Signed)
History and Physical Interval Note:  01/09/2016 10:00 AM  Mario Moore  has presented today for surgery, with the diagnosis of LEFT KNEE MEDIAL MENISCAL TEAR   The various methods of treatment have been discussed with the patient and family. After consideration of risks, benefits and other options for treatment, the patient has consented to  Procedure(s): LEFT KNEE ARTHROSCOPY WITH MEDIAL DEBRIDEMENT  (Left) as a surgical intervention .  The patient's history has been reviewed, patient examined, no change in status, stable for surgery.  I have reviewed the patient's chart and labs.  Questions were answered to the patient's satisfaction.     Gearlean Alf

## 2016-01-09 NOTE — Anesthesia Postprocedure Evaluation (Signed)
Anesthesia Post Note  Patient: Jaece Heredia  Procedure(s) Performed: Procedure(s) (LRB): LEFT KNEE ARTHROSCOPY WITH MEDIAL DEBRIDEMENT  (Left)  Patient location during evaluation: PACU Anesthesia Type: General Level of consciousness: awake and alert Pain management: pain level controlled Vital Signs Assessment: post-procedure vital signs reviewed and stable Respiratory status: spontaneous breathing, nonlabored ventilation, respiratory function stable and patient connected to nasal cannula oxygen Cardiovascular status: blood pressure returned to baseline and stable Postop Assessment: no signs of nausea or vomiting Anesthetic complications: no    Last Vitals:  Filed Vitals:   01/09/16 1145 01/09/16 1206  BP: 118/72 121/88  Pulse: 62 55  Temp: 36.4 C 36.3 C  Resp: 19 16    Last Pain:  Filed Vitals:   01/09/16 1213  PainSc: 1                  Hobie Kohles JENNETTE

## 2016-01-09 NOTE — Op Note (Signed)
Preoperative diagnosis-  Left knee medial meniscal tear  Postoperative diagnosis Left- knee medial meniscal tear   Procedure- Left knee arthroscopy with medial  meniscal debridement    Surgeon- Dione Plover. Miana Politte, MD  Anesthesia-General  EBL-  Minimal  Complications- None  Condition- PACU - hemodynamically stable.  Brief clinical note- -Mario Moore is a 45 y.o.  male with a several month history of left knee pain and mechanical symptoms. Exam and history suggested medial meniscal tear confirmed by MRI. The patient presents now for arthroscopy and debridement   Procedure in detail -       After successful administration of General anesthetic, a tourmiquet is placed high on the Left  thigh and the Left lower extremity is prepped and draped in the usual sterile fashion. Time out is performed by the surgical team. Standard superomedial and inferolateral portal sites are marked and incisions made with an 11 blade. The inflow cannula is passed through the superomedial portal and camera through the inferolateral portal and inflow is initiated. Arthroscopic visualization proceeds.      The undersurface of the patella and trochlea are visualized and they are normal. The medial and lateral gutters are visualized and there are  no loose bodies. Flexion and valgus force is applied to the knee and the medial compartment is entered. A spinal needle is passed into the joint through the site marked for the inferomedial portal. A small incision is made and the dilator passed into the joint. The findings for the medial compartment are unstable tear of body and posterior horn medial meniscus with no chondral defects. . The tear is debrided to a stable base with baskets and a shaver and sealed off with the Arthrocare.It is probed and found to be stable.    The intercondylar notch is visualized and the ACL appears normal. The lateral compartment is entered and the findings are normal .      The joint is again  inspected and there are no other tears, defects or loose bodies identified. The arthroscopic equipment is then removed from the inferior portals which are closed with interrupted 4-0 nylon. 20 ml of .25% Marcaine with epinephrine are injected through the inflow cannula and the cannula is then removed and the portal closed with nylon. The incisions are cleaned and dried and a bulky sterile dressing is applied. The patient is then awakened and transported to recovery in stable condition.   01/09/2016, 11:06 AM

## 2016-01-09 NOTE — Transfer of Care (Signed)
Immediate Anesthesia Transfer of Care Note  Patient: Mario Moore  Procedure(s) Performed: Procedure(s): LEFT KNEE ARTHROSCOPY WITH MEDIAL DEBRIDEMENT  (Left)  Patient Location: PACU  Anesthesia Type:General  Level of Consciousness: awake, alert , oriented and patient cooperative  Airway & Oxygen Therapy: Patient Spontanous Breathing and Patient connected to face mask oxygen  Post-op Assessment: Report given to RN, Post -op Vital signs reviewed and stable and Patient moving all extremities  Post vital signs: Reviewed and stable  Last Vitals:  Filed Vitals:   01/09/16 0812  BP: 120/71  Pulse: 55  Temp: 36.5 C  Resp: 16    Complications: No apparent anesthesia complications

## 2016-01-09 NOTE — H&P (Signed)
  CC- Mario Moore is a 45 y.o. male who presents with left knee pain.  HPI- . Knee Pain: Patient presents with knee pain involving the  left knee. Onset of the symptoms was several months ago. Inciting event: none known. Current symptoms include giving out, pain located medially and swelling. Pain is aggravated by lateral movements, rising after sitting, running, squatting and walking.  Patient has had no prior knee problems. Evaluation to date: MRI: abnormal medial meniscal tear. Treatment to date: corticosteroid injection which was not very effective.  Past Medical History  Diagnosis Date  . H/O hereditary hemorrhagic telangiectasia (HHT)     being tx. daily  . History of seasonal allergies     tx. Cetirizine  . Anemia     due to frequency of nose bleeds- not a problem now-uses OTC iron supplement    Past Surgical History  Procedure Laterality Date  . Nasoseptolplasty    . Vasectomy    . Lasik      Prior to Admission medications   Medication Sig Start Date End Date Taking? Authorizing Provider  cetirizine (ZYRTEC) 10 MG tablet Take 10 mg by mouth daily.   Yes Historical Provider, MD  Estriol POWD Place 1 application into the nose 2 (two) times daily. Compounded at [facility] 12/19/15  Yes Historical Provider, MD  ferrous sulfate (IRON SUPPLEMENT) 325 (65 FE) MG tablet Take 325 mg by mouth 2 (two) times daily.   Yes Historical Provider, MD   KNEE EXAM antalgic gait, soft tissue tenderness over medial joint line, effusion, negative drawer sign, collateral ligaments intact  Physical Examination: General appearance - alert, well appearing, and in no distress Mental status - alert, oriented to person, place, and time Chest - clear to auscultation, no wheezes, rales or rhonchi, symmetric air entry Heart - normal rate, regular rhythm, normal S1, S2, no murmurs, rubs, clicks or gallops Abdomen - soft, nontender, nondistended, no masses or organomegaly Neurological - alert,  oriented, normal speech, no focal findings or movement disorder noted   Asessment/Plan--- Left knee medial meniscal tear- - Plan left knee arthroscopy with meniscal debridement. Procedure risks and potential comps discussed with patient who elects to proceed. Goals are decreased pain and increased function with a high likelihood of achieving both

## 2016-01-09 NOTE — Discharge Instructions (Signed)
° °Dr. Lakya Schrupp °Total Joint Specialist °[facility] °[address] °, [place] [address] °[phone] ° ° °Arthroscopic Procedure, Knee °An arthroscopic procedure can find what is wrong with your knee. °PROCEDURE °Arthroscopy is a surgical technique that allows your orthopedic surgeon to diagnose and treat your knee injury with accuracy. They will look into your knee through a small instrument. This is almost like a small (pencil sized) telescope. Because arthroscopy affects your knee less than open knee surgery, you can anticipate a more rapid recovery. Taking an active role by following your caregiver's instructions will help with rapid and complete recovery. Use crutches, rest, elevation, ice, and knee exercises as instructed. The length of recovery depends on various factors including type of injury, age, physical condition, medical conditions, and your rehabilitation. °Your knee is the joint between the large bones (femur and tibia) in your leg. Cartilage covers these bone ends which are smooth and slippery and allow your knee to bend and move smoothly. Two menisci, thick, semi-lunar shaped pads of cartilage which form a rim inside the joint, help absorb shock and stabilize your knee. Ligaments bind the bones together and support your knee joint. Muscles move the joint, help support your knee, and take stress off the joint itself. Because of this all programs and physical therapy to rehabilitate an injured or repaired knee require rebuilding and strengthening your muscles. °AFTER THE PROCEDURE °· After the procedure, you will be moved to a recovery area until most of the effects of the medication have worn off. Your caregiver will discuss the test results with you.  °· Only take over-the-counter or prescription medicines for pain, discomfort, or fever as directed by your caregiver.  °SEEK MEDICAL CARE IF:  °· You have increased bleeding from your wounds.  °· You see  redness, swelling, or have increasing pain in your wounds.  °· You have pus coming from your wound.  °· You have an oral temperature above 102° F (38.9° C).  °· You notice a bad smell coming from the wound or dressing.  °· You have severe pain with any motion of your knee.  °SEEK IMMEDIATE MEDICAL CARE IF:  °· You develop a rash.  °· You have difficulty breathing.  °· You have any allergic problems.  °FURTHER INSTRUCTIONS:  °· ICE to the affected knee every three hours for 30 minutes at a time and then as needed for pain and swelling.  Continue to use ice on the knee for pain and swelling from surgery. You may notice swelling that will progress down to the foot and ankle.  This is normal after surgery.  Elevate the leg when you are not up walking on it.   ° °DIET °You may resume your previous home diet once your are discharged from the hospital. ° °DRESSING / WOUND CARE / SHOWERING °You may start showering two days after being discharged home but do not submerge the incisions under water.  °Change dressing 48 hours after the procedure and then cover the small incisions with band aids until your follow up visit. °Change the surgical dressings daily and reapply a dry dressing each time.  ° °ACTIVITY °Walk with your walker as instructed. °Use walker as long as suggested by your caregivers. °Avoid periods of inactivity such as sitting longer than an hour when not asleep. This helps prevent blood clots.  °You may resume a sexual relationship in one month or when given the OK by your doctor.  °You may return to   work once you are cleared by your doctor.  °Do not drive a car for 6 weeks or until released by you surgeon.  °Do not drive while taking narcotics. ° °WEIGHT BEARING AS TOLERATED ° °POSTOPERATIVE CONSTIPATION PROTOCOL °Constipation - defined medically as fewer than three stools per week and severe constipation as less than one stool per week. ° °One of the most common issues patients have following surgery is  constipation.  Even if you have a regular bowel pattern at home, your normal regimen is likely to be disrupted due to multiple reasons following surgery.  Combination of anesthesia, postoperative narcotics, change in appetite and fluid intake all can affect your bowels.  In order to avoid complications following surgery, here are some recommendations in order to help you during your recovery period. ° °Colace (docusate) - Pick up an over-the-counter form of Colace or another stool softener and take twice a day as long as you are requiring postoperative pain medications.  Take with a full glass of water daily.  If you experience loose stools or diarrhea, hold the colace until you stool forms back up.  If your symptoms do not get better within 1 week or if they get worse, check with your doctor. ° °Dulcolax (bisacodyl) - Pick up over-the-counter and take as directed by the product packaging as needed to assist with the movement of your bowels.  Take with a full glass of water.  Use this product as needed if not relieved by Colace only.  ° °MiraLax (polyethylene glycol) - Pick up over-the-counter to have on hand.  MiraLax is a solution that will increase the amount of water in your bowels to assist with bowel movements.  Take as directed and can mix with a glass of water, juice, soda, coffee, or tea.  Take if you go more than two days without a movement. °Do not use MiraLax more than once per day. Call your doctor if you are still constipated or irregular after using this medication for 7 days in a row. ° °If you continue to have problems with postoperative constipation, please contact the office for further assistance and recommendations.  If you experience "the worst abdominal pain ever" or develop nausea or vomiting, please contact the office immediatly for further recommendations for treatment. ° °ITCHING ° If you experience itching with your medications, try taking only a single pain pill, or even half a pain pill  at a time.  You can also use Benadryl over the counter for itching or also to help with sleep.  ° °TED HOSE STOCKINGS °Wear the elastic stockings on both legs for three weeks following surgery during the day but you may remove then at night for sleeping. ° °MEDICATIONS °See your medication summary on the “After Visit Summary” that the nursing staff will review with you prior to discharge.  You may have some home medications which will be placed on hold until you complete the course of blood thinner medication.  It is important for you to complete the blood thinner medication as prescribed by your surgeon.  Continue your approved medications as instructed at time of discharge. °Do not drive while taking narcotics.  ° °PRECAUTIONS °If you experience chest pain or shortness of breath - call 911 immediately for transfer to the hospital emergency department.  °If you develop a fever greater that 101 F, purulent drainage from wound, increased redness or drainage from wound, foul odor from the wound/dressing, or calf pain - CONTACT YOUR SURGEON.   °                                                °  FOLLOW-UP APPOINTMENTS °Make sure you keep all of your appointments after your operation with your surgeon and caregivers. You should call the office at [phone]  and make an appointment for approximately one week after the date of your surgery or on the date instructed by your surgeon outlined in the "After Visit Summary". ° °RANGE OF MOTION AND STRENGTHENING EXERCISES  °Rehabilitation of the knee is important following a knee injury or an operation. After just a few days of immobilization, the muscles of the thigh which control the knee become weakened and shrink (atrophy). Knee exercises are designed to build up the tone and strength of the thigh muscles and to improve knee motion. Often times heat used for twenty to thirty minutes before working out will loosen up your tissues and help with improving the range of motion  but do not use heat for the first two weeks following surgery. These exercises can be done on a training (exercise) mat, on the floor, on a table or on a bed. Use what ever works the best and is most comfortable for you Knee exercises include: ° °QUAD STRENGTHENING EXERCISES °Strengthening Quadriceps Sets ° °Tighten muscles on top of thigh by pushing knees down into floor or table. °Hold for 20 seconds. Repeat 10 times. °Do 2 sessions per day. ° ° ° ° °Strengthening Terminal Knee Extension ° °With knee bent over bolster, straighten knee by tightening muscle on top of thigh. Be sure to keep bottom of knee on bolster. °Hold for 20 seconds. Repeat 10 times. °Do 2 sessions per day. ° ° °Straight Leg with Bent Knee ° °Lie on back with opposite leg bent. Keep involved knee slightly bent at knee and raise leg 4-6". Hold for 10 seconds. °Repeat 20 times per set. °Do 2 sets per session. °Do 2 sessions per day. ° °

## 2016-01-09 NOTE — Anesthesia Preprocedure Evaluation (Addendum)
Anesthesia Evaluation  Patient identified by MRN, date of birth, ID band Patient awake    Reviewed: Allergy & Precautions, NPO status , Patient's Chart, lab work & pertinent test results  Airway Mallampati: II  TM Distance: >3 FB Neck ROM: Full    Dental no notable dental hx. (+) Dental Advisory Given   Pulmonary neg pulmonary ROS,    Pulmonary exam normal breath sounds clear to auscultation       Cardiovascular negative cardio ROS Normal cardiovascular exam Rhythm:Regular Rate:Normal     Neuro/Psych negative neurological ROS  negative psych ROS   GI/Hepatic negative GI ROS, Neg liver ROS,   Endo/Other  negative endocrine ROS  Renal/GU negative Renal ROS  negative genitourinary   Musculoskeletal negative musculoskeletal ROS (+)   Abdominal   Peds negative pediatric ROS (+)  Hematology Hx of HHT, followed at [facility]   Anesthesia Other Findings   Reproductive/Obstetrics negative OB ROS                            Anesthesia Physical Anesthesia Plan  ASA: II  Anesthesia Plan: General   Post-op Pain Management:    Induction: Intravenous  Airway Management Planned: LMA  Additional Equipment:   Intra-op Plan:   Post-operative Plan: Extubation in OR  Informed Consent: I have reviewed the patients History and Physical, chart, labs and discussed the procedure including the risks, benefits and alternatives for the proposed anesthesia with the patient or authorized representative who has indicated his/her understanding and acceptance.   Dental advisory given  Plan Discussed with: CRNA  Anesthesia Plan Comments:         Anesthesia Quick Evaluation

## 2016-01-09 NOTE — Anesthesia Procedure Notes (Signed)
Procedure Name: LMA Insertion Date/Time: 01/09/2016 10:19 AM Performed by: Carleene Cooper A Pre-anesthesia Checklist: Patient identified, Timeout performed, Emergency Drugs available, Suction available and Patient being monitored Patient Re-evaluated:Patient Re-evaluated prior to inductionOxygen Delivery Method: Circle system utilized Preoxygenation: Pre-oxygenation with 100% oxygen Intubation Type: IV induction Ventilation: Mask ventilation without difficulty LMA: LMA with gastric port inserted LMA Size: 4.0 Number of attempts: 1 Placement Confirmation: positive ETCO2 and breath sounds checked- equal and bilateral Tube secured with: Tape Dental Injury: Teeth and Oropharynx as per pre-operative assessment

## 2016-05-08 DIAGNOSIS — K080 Exfoliation of teeth due to systemic causes: Secondary | ICD-10-CM | POA: Diagnosis not present

## 2016-09-09 DIAGNOSIS — M25561 Pain in right knee: Secondary | ICD-10-CM | POA: Diagnosis not present

## 2016-11-18 DIAGNOSIS — K080 Exfoliation of teeth due to systemic causes: Secondary | ICD-10-CM | POA: Diagnosis not present

## 2017-02-09 DIAGNOSIS — M9905 Segmental and somatic dysfunction of pelvic region: Secondary | ICD-10-CM | POA: Diagnosis not present

## 2017-02-09 DIAGNOSIS — M9903 Segmental and somatic dysfunction of lumbar region: Secondary | ICD-10-CM | POA: Diagnosis not present

## 2017-02-09 DIAGNOSIS — M791 Myalgia: Secondary | ICD-10-CM | POA: Diagnosis not present

## 2017-02-09 DIAGNOSIS — M62830 Muscle spasm of back: Secondary | ICD-10-CM | POA: Diagnosis not present

## 2017-02-09 DIAGNOSIS — M9902 Segmental and somatic dysfunction of thoracic region: Secondary | ICD-10-CM | POA: Diagnosis not present

## 2017-02-09 DIAGNOSIS — M5431 Sciatica, right side: Secondary | ICD-10-CM | POA: Diagnosis not present

## 2017-02-13 DIAGNOSIS — M5431 Sciatica, right side: Secondary | ICD-10-CM | POA: Diagnosis not present

## 2017-02-13 DIAGNOSIS — M9903 Segmental and somatic dysfunction of lumbar region: Secondary | ICD-10-CM | POA: Diagnosis not present

## 2017-02-13 DIAGNOSIS — M791 Myalgia: Secondary | ICD-10-CM | POA: Diagnosis not present

## 2017-02-13 DIAGNOSIS — M62830 Muscle spasm of back: Secondary | ICD-10-CM | POA: Diagnosis not present

## 2017-02-13 DIAGNOSIS — M9905 Segmental and somatic dysfunction of pelvic region: Secondary | ICD-10-CM | POA: Diagnosis not present

## 2017-02-13 DIAGNOSIS — M9902 Segmental and somatic dysfunction of thoracic region: Secondary | ICD-10-CM | POA: Diagnosis not present

## 2017-02-26 DIAGNOSIS — M9905 Segmental and somatic dysfunction of pelvic region: Secondary | ICD-10-CM | POA: Diagnosis not present

## 2017-02-26 DIAGNOSIS — M5137 Other intervertebral disc degeneration, lumbosacral region: Secondary | ICD-10-CM | POA: Diagnosis not present

## 2017-02-26 DIAGNOSIS — M9903 Segmental and somatic dysfunction of lumbar region: Secondary | ICD-10-CM | POA: Diagnosis not present

## 2017-02-26 DIAGNOSIS — M5136 Other intervertebral disc degeneration, lumbar region: Secondary | ICD-10-CM | POA: Diagnosis not present

## 2017-03-02 DIAGNOSIS — F4323 Adjustment disorder with mixed anxiety and depressed mood: Secondary | ICD-10-CM | POA: Diagnosis not present

## 2017-03-19 DIAGNOSIS — M5136 Other intervertebral disc degeneration, lumbar region: Secondary | ICD-10-CM | POA: Diagnosis not present

## 2017-03-19 DIAGNOSIS — M5137 Other intervertebral disc degeneration, lumbosacral region: Secondary | ICD-10-CM | POA: Diagnosis not present

## 2017-03-19 DIAGNOSIS — M9905 Segmental and somatic dysfunction of pelvic region: Secondary | ICD-10-CM | POA: Diagnosis not present

## 2017-03-19 DIAGNOSIS — M9903 Segmental and somatic dysfunction of lumbar region: Secondary | ICD-10-CM | POA: Diagnosis not present

## 2017-05-08 DIAGNOSIS — M5416 Radiculopathy, lumbar region: Secondary | ICD-10-CM | POA: Diagnosis not present

## 2017-05-14 DIAGNOSIS — M5136 Other intervertebral disc degeneration, lumbar region: Secondary | ICD-10-CM | POA: Diagnosis not present

## 2017-05-14 DIAGNOSIS — M9903 Segmental and somatic dysfunction of lumbar region: Secondary | ICD-10-CM | POA: Diagnosis not present

## 2017-05-14 DIAGNOSIS — M9905 Segmental and somatic dysfunction of pelvic region: Secondary | ICD-10-CM | POA: Diagnosis not present

## 2017-05-14 DIAGNOSIS — M5137 Other intervertebral disc degeneration, lumbosacral region: Secondary | ICD-10-CM | POA: Diagnosis not present

## 2017-05-21 ENCOUNTER — Ambulatory Visit (INDEPENDENT_AMBULATORY_CARE_PROVIDER_SITE_OTHER): Payer: Federal, State, Local not specified - PPO | Admitting: Family Medicine

## 2017-05-21 ENCOUNTER — Encounter: Payer: Self-pay | Admitting: Family Medicine

## 2017-05-21 VITALS — BP 117/71 | HR 50 | Temp 97.7°F | Ht 72.0 in | Wt 199.0 lb

## 2017-05-21 DIAGNOSIS — Z0000 Encounter for general adult medical examination without abnormal findings: Secondary | ICD-10-CM

## 2017-05-21 DIAGNOSIS — I780 Hereditary hemorrhagic telangiectasia: Secondary | ICD-10-CM | POA: Diagnosis not present

## 2017-05-21 DIAGNOSIS — M5126 Other intervertebral disc displacement, lumbar region: Secondary | ICD-10-CM | POA: Insufficient documentation

## 2017-05-21 DIAGNOSIS — R748 Abnormal levels of other serum enzymes: Secondary | ICD-10-CM

## 2017-05-21 LAB — LIPID PANEL
CHOL/HDL RATIO: 2
Cholesterol: 128 mg/dL (ref 0–200)
HDL: 58.9 mg/dL (ref >39.00)
LDL CALC: 57 mg/dL (ref 0–99)
NonHDL: 69.1
TRIGLYCERIDES: 59 mg/dL (ref 0.0–149.0)
VLDL: 11.8 mg/dL (ref 0.0–40.0)

## 2017-05-21 LAB — BASIC METABOLIC PANEL
BUN: 14 mg/dL (ref 6–23)
CALCIUM: 9.6 mg/dL (ref 8.4–10.5)
CO2: 31 mEq/L (ref 19–32)
CREATININE: 0.99 mg/dL (ref 0.40–1.50)
Chloride: 104 mEq/L (ref 96–112)
GFR: 85.76 mL/min (ref >60.00)
Glucose, Bld: 96 mg/dL (ref 70–99)
POTASSIUM: 3.8 meq/L (ref 3.5–5.1)
Sodium: 140 mEq/L (ref 135–145)

## 2017-05-21 LAB — HEPATIC FUNCTION PANEL
ALBUMIN: 4.4 g/dL (ref 3.5–5.2)
ALT: 100 U/L — ABNORMAL HIGH (ref 0–53)
AST: 48 U/L — ABNORMAL HIGH (ref 0–37)
Alkaline Phosphatase: 123 U/L — ABNORMAL HIGH (ref 39–117)
Bilirubin, Direct: 0.2 mg/dL (ref 0.0–0.3)
Total Bilirubin: 0.8 mg/dL (ref 0.2–1.2)
Total Protein: 6.8 g/dL (ref 6.0–8.3)

## 2017-05-21 LAB — CBC WITH DIFFERENTIAL/PLATELET
BASOS ABS: 0 10*3/uL (ref 0.0–0.1)
Basophils Relative: 1.4 % (ref 0.0–3.0)
EOS ABS: 0.1 10*3/uL (ref 0.0–0.7)
Eosinophils Relative: 3.8 % (ref 0.0–5.0)
HEMATOCRIT: 44.7 % (ref 39.0–52.0)
HEMOGLOBIN: 15.1 g/dL (ref 13.0–17.0)
Lymphocytes Relative: 25.2 % (ref 12.0–46.0)
Lymphs Abs: 0.8 10*3/uL (ref 0.7–4.0)
MCHC: 33.9 g/dL (ref 30.0–36.0)
MCV: 90.5 fl (ref 78.0–100.0)
MONO ABS: 0.4 10*3/uL (ref 0.1–1.0)
Monocytes Relative: 13 % — ABNORMAL HIGH (ref 3.0–12.0)
Neutro Abs: 1.8 10*3/uL (ref 1.4–7.7)
Neutrophils Relative %: 56.6 % (ref 43.0–77.0)
PLATELETS: 178 10*3/uL (ref 150.0–400.0)
RBC: 4.93 Mil/uL (ref 4.22–5.81)
RDW: 14.4 % (ref 11.5–15.5)
WBC: 3.1 10*3/uL — AB (ref 4.0–10.5)

## 2017-05-21 LAB — POC URINALSYSI DIPSTICK (AUTOMATED)
Bilirubin, UA: NEGATIVE
Glucose, UA: NEGATIVE
Ketones, UA: NEGATIVE
Leukocytes, UA: NEGATIVE
NITRITE UA: NEGATIVE
PH UA: 6 (ref 5.0–8.0)
Protein, UA: NEGATIVE
RBC UA: NEGATIVE
UROBILINOGEN UA: 0.2 U/dL

## 2017-05-21 LAB — TSH: TSH: 6.24 u[IU]/mL — AB (ref 0.35–4.50)

## 2017-05-21 LAB — PSA: PSA: 0.51 ng/mL (ref 0.10–4.00)

## 2017-05-21 NOTE — Progress Notes (Signed)
   Subjective:    Patient ID: Mario Moore, male    DOB: Apr 24, 1971, 46 y.o.   MRN: [id number]  HPI 46 yr old male to establish with [place] after transferring from [facility]. He is also for a well exam. He feels fine and has no complaints. He has Hereditary Hemorrhagic Telangectasia (HHT) and he is followed by [facility]. This has only manifested with frequent nosebleeds, but these have been well controlled by using topical Estriol powder in the nostrils. He has intermittent low back pain from a herniated disc, but he gets acupuncture treatments regularly and these have been very effective for him.    Review of Systems  Constitutional: Negative.   HENT: Negative.   Eyes: Negative.   Respiratory: Negative.   Cardiovascular: Negative.   Gastrointestinal: Negative.   Genitourinary: Negative.   Musculoskeletal: Positive for back pain. Negative for arthralgias, gait problem, joint swelling, myalgias, neck pain and neck stiffness.  Skin: Negative.   Neurological: Negative.   Psychiatric/Behavioral: Negative.        Objective:   Physical Exam  Constitutional: He is oriented to person, place, and time. He appears well-developed and well-nourished. No distress.  HENT:  Head: Normocephalic and atraumatic.  Right Ear: External ear normal.  Left Ear: External ear normal.  Nose: Nose normal.  Mouth/Throat: Oropharynx is clear and moist. No oropharyngeal exudate.  Eyes: Conjunctivae and EOM are normal. Pupils are equal, round, and reactive to light. Right eye exhibits no discharge. Left eye exhibits no discharge. No scleral icterus.  Neck: Neck supple. No JVD present. No tracheal deviation present. No thyromegaly present.  Cardiovascular: Normal rate, regular rhythm, normal heart sounds and intact distal pulses.  Exam reveals no gallop and no friction rub.   No murmur heard. Pulmonary/Chest: Effort normal and breath sounds normal. No respiratory distress. He has no  wheezes. He has no rales. He exhibits no tenderness.  Abdominal: Soft. Bowel sounds are normal. He exhibits no distension and no mass. There is no tenderness. There is no rebound and no guarding.  Genitourinary: Rectum normal, prostate normal and penis normal. Rectal exam shows guaiac negative stool. No penile tenderness.  Musculoskeletal: Normal range of motion. He exhibits no edema or tenderness.  Lymphadenopathy:    He has no cervical adenopathy.  Neurological: He is alert and oriented to person, place, and time. He has normal reflexes. No cranial nerve deficit. He exhibits normal muscle tone. Coordination normal.  Skin: Skin is warm and dry. No rash noted. He is not diaphoretic. No erythema. No pallor.  Psychiatric: He has a normal mood and affect. His behavior is normal. Judgment and thought content normal.          Assessment & Plan:  Well exam. We discussed diet and exercise. Get fasting labs. We will refer him back to the [facility] clinic for the [place].  Alysia Penna, MD

## 2017-05-21 NOTE — Patient Instructions (Signed)
WE NOW OFFER   [facility] FAST TRACK!!!  SAME DAY Appointments for ACUTE CARE  Such as: Sprains, Injuries, cuts, abrasions, rashes, muscle pain, joint pain, back pain Colds, flu, sore throats, headache, allergies, cough, fever  Ear pain, sinus and eye infections Abdominal pain, nausea, vomiting, diarrhea, upset stomach Animal/insect bites  3 Easy Ways to Schedule: Walk-In Scheduling Call in scheduling Mychart Sign-up: [email or web address]         

## 2017-05-22 ENCOUNTER — Encounter: Payer: Self-pay | Admitting: Internal Medicine

## 2017-05-22 ENCOUNTER — Other Ambulatory Visit: Payer: Self-pay | Admitting: Family Medicine

## 2017-05-22 MED ORDER — LEVOTHYROXINE SODIUM 75 MCG PO TABS: 75.0000 ug | ORAL_TABLET | Freq: Every day | ORAL | 1 refills | Status: DC

## 2017-05-22 NOTE — Addendum Note (Signed)
Addended by: Alysia Penna A on: 05/22/2017 01:03 PM   Modules accepted: Orders

## 2017-05-27 DIAGNOSIS — K080 Exfoliation of teeth due to systemic causes: Secondary | ICD-10-CM | POA: Diagnosis not present

## 2017-07-06 ENCOUNTER — Encounter: Payer: Self-pay | Admitting: Internal Medicine

## 2017-07-06 ENCOUNTER — Ambulatory Visit (INDEPENDENT_AMBULATORY_CARE_PROVIDER_SITE_OTHER): Payer: Federal, State, Local not specified - PPO | Admitting: Internal Medicine

## 2017-07-06 ENCOUNTER — Other Ambulatory Visit (INDEPENDENT_AMBULATORY_CARE_PROVIDER_SITE_OTHER): Payer: Federal, State, Local not specified - PPO

## 2017-07-06 VITALS — BP 96/60 | HR 56 | Ht 71.25 in | Wt 198.4 lb

## 2017-07-06 DIAGNOSIS — R748 Abnormal levels of other serum enzymes: Secondary | ICD-10-CM

## 2017-07-06 LAB — HEPATIC FUNCTION PANEL
ALT: 112 U/L — ABNORMAL HIGH (ref 0–53)
AST: 65 U/L — ABNORMAL HIGH (ref 0–37)
Albumin: 4.3 g/dL (ref 3.5–5.2)
Alkaline Phosphatase: 135 U/L — ABNORMAL HIGH (ref 39–117)
BILIRUBIN DIRECT: 0.1 mg/dL (ref 0.0–0.3)
BILIRUBIN TOTAL: 0.6 mg/dL (ref 0.2–1.2)
Total Protein: 7.2 g/dL (ref 6.0–8.3)

## 2017-07-06 LAB — IGA: IGA: 514 mg/dL — AB (ref 68–378)

## 2017-07-06 NOTE — Patient Instructions (Signed)
Your physician has requested that you go to the basement for lab work before leaving today.   You have been scheduled for an abdominal ultrasound at [facility] at [address]. On 07/14/17 at 9:45AM. Please arrive 20 minutes prior to your appointment for registration. Make sure and have nothing to eat after midnight. Should you need to reschedule your appointment, please contact radiology at [phone] This test typically takes about 30 minutes to perform.    I appreciate the opportunity to care for you. Silvano Rusk, MD, [facility]

## 2017-07-06 NOTE — Progress Notes (Signed)
Mario Moore 46 y.o. 02/12/1971 [id number] Referred by: Mario Morale, MD  Assessment & Plan:   Encounter Diagnoses  Name Primary?  . Abnormal transaminases Yes  . Abnormal alkaline phosphatase test    Chronic recurrent mild elevation of transaminases and alkaline phosphatase since the 1990s. Previous testing has shown negative hepatitis C. He is immunized against hepatitis B. Positive antibody. Cause of this is not entirely clear. I don't think it's related to his hereditary hemorrhagic telangiectasia. You can get AVMs in the liver but I can find no association of abnormal liver chemistries and HHT. No signs of advanced liver disease on physical exam or other lab testing. He does not suffer from myopathy-like symptoms or myositis symptoms. He is not on a statin. Trivial elevation and TSH of probably no relationship. Alcohol and medications do not seem implicated either. I don't quite understand why he had a high iron saturation in 2015 but a ferritin of 30. He has never had transfusions. May need to discuss with hematology. Probably unrelated also.  Likely benign innocent changes. Will evaluate with the following testing:  [place] RUQ LFT, alpha-1-antitrypsin level, mitochondrial Abs, ANA, anti-smooth mm Ab and TTG Ab and IgA level  I appreciate the opportunity to care for this patient. CC: Mario Morale, MD   Subjective:   Chief Complaint:Abnormal liver chemistries  HPI The patient is a very pleasant well developed well-nourished 46 year old married white man with a long history of abnormal liver chemistries, he was recently evaluated by Dr. Sarajane Moore with following lab test results.  Lab Results  Component Value Date   ALT 100 (H) 05/21/2017   AST 48 (H) 05/21/2017   ALKPHOS 123 (H) 05/21/2017   BILITOT 0.8 05/21/2017   This is not a new problem. The patient has records to go back to the late 1990s showing mild elevation of ALT or AST and GGT and sometimes alkaline  phosphatase. In 1999 he saw a gastroenterologist in [place] and had abdominal ultrasound, it was normal. He also had negative hepatitis C testing hepatitis A testing and showed that he was positive for hepatitis B surface antibody reflective of prior immunization. He drinks a few alcoholic beverages on the weekends only. He denies any significant myalgias or muscle aches or heavy fast food eating, as no family history of liver disease. He does have hereditary hemorrhagic telangiectasia. That is under good control with the use of a low-dose estrogen nasal gel. Epistaxis has resolved and anemia has resolved using that. He does not even take iron supplementation anymore. His GI review of systems and symptoms is entirely negative. Allergies  Allergen Reactions  . Ibuprofen Other (See Comments)    HHT  . Aspirin     Thins out blood, has HHT   Current Meds  Medication Sig  . MELATONIN PO Take 1-3 mg by mouth as needed.   Past Medical History:  Diagnosis Date  . Anemia    due to frequency of nose bleeds- not a problem now-uses OTC iron supplement  . Complication of anesthesia    slow to wake up after surgery  . History of seasonal allergies    tx. Cetirizine  . Hypothyroidism   . Low back pain    gets acupuncture from Dr. Lynnda Moore at [facility]  . Personal history of hereditary hemorrhagic telangiectasia (HHT)    sees Dr. Marchelle Moore at [facility]    Past Surgical History:  Procedure Laterality Date  . KNEE ARTHROSCOPY WITH  MEDIAL MENISECTOMY Left 01/09/2016   Procedure: LEFT KNEE ARTHROSCOPY WITH MEDIAL DEBRIDEMENT ;  Surgeon: Gaynelle Arabian, MD;  Location: [facility];  Service: Orthopedics;  Laterality: Left;  . LASIK Bilateral 1995  . nasoseptolplasty    . VASECTOMY     Social History   Social History  . Marital status: Married    Spouse name: N/A  . Number of children: 2   Occupational History  . [employer]    Social History Main Topics  . Smoking  status: Never Smoker  . Smokeless tobacco: Never Used  . Alcohol use Yes     Comment: beer on weekends -occ.  . Drug use: No  . 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   Review of Systems Nasal estrogen gel has stopped epistaxis  Slight elevation of TSH - recently started on TSH  Objective:   Physical Exam @BP  96/60 (BP Location: Left Arm, Patient Position: Sitting, Cuff Size: Normal)   Pulse (!) 56   Ht 5' 11.25" (1.81 m) Comment: height measured without shoes  Wt 198 lb 6 oz (90 kg)   BMI 27.47 kg/m @  General:  Well-developed, well-nourished and in no acute distress Eyes:  anicteric.  Lungs: Clear to auscultation bilaterally. Heart:  S1S2, no rubs, murmurs, gallops. Abdomen:  soft, non-tender, no hepatosplenomegaly, hernia, or mass and BS+. Liver percusses to about 8-9 cm. Liver edge is not palpable. Extremities:   no edema, cyanosis or clubbing Skin   no rash. Multipe cherry nevi vs telangiectasia Neuro:  A&O x 3.  Psych:  appropriate mood and  Affect.   Data Reviewed: Ferritin - Final result (03/14/2014 12:01 PM) Ferritin - Final result (03/14/2014 12:01 PM)  Component Value Range  Ferritin 30 27-377 ng/mL   Ferritin - Final result (03/14/2014 12:01 PM)  Specimen  Other    Iron Panel - Final result (03/14/2014 12:01 PM) Iron Panel - Final result (03/14/2014 12:01 PM)  Component Value Range  Iron 372 (H) 35-165 ug/dL  Transferrin 325 200-380 mg/dL  TIBC 410 252-479 ug/dL  Iron Saturation (%) 91 (H) 20-50 %

## 2017-07-07 LAB — TISSUE TRANSGLUTAMINASE, IGA: Tissue Transglutaminase Ab, IgA: 1 U/mL (ref <4)

## 2017-07-07 LAB — MITOCHONDRIAL ANTIBODIES: Mitochondrial M2 Ab, IgG: <=20 Units (ref <=20.0)

## 2017-07-07 LAB — ANA: Anti Nuclear Antibody(ANA): NEGATIVE

## 2017-07-08 LAB — ANTI-SMOOTH MUSCLE ANTIBODY, IGG: Smooth Muscle Ab: <20 U (ref <20)

## 2017-07-08 LAB — ALPHA-1-ANTITRYPSIN: A-1 Antitrypsin, Ser: 139 mg/dL (ref 83–199)

## 2017-07-10 ENCOUNTER — Encounter: Payer: Self-pay | Admitting: Internal Medicine

## 2017-07-10 ENCOUNTER — Other Ambulatory Visit: Payer: Self-pay | Admitting: Internal Medicine

## 2017-07-10 DIAGNOSIS — R790 Abnormal level of blood mineral: Secondary | ICD-10-CM

## 2017-07-10 DIAGNOSIS — R748 Abnormal levels of other serum enzymes: Secondary | ICD-10-CM

## 2017-07-10 NOTE — Progress Notes (Signed)
My Chart note about labs

## 2017-07-13 ENCOUNTER — Encounter: Payer: Self-pay | Admitting: Internal Medicine

## 2017-07-14 ENCOUNTER — Ambulatory Visit
Admission: RE | Admit: 2017-07-14 | Discharge: 2017-07-14 | Disposition: A | Discharge status: Home / Self Care | Payer: Federal, State, Local not specified - PPO | Source: Ambulatory Visit | Attending: Internal Medicine | Admitting: Internal Medicine

## 2017-07-14 ENCOUNTER — Other Ambulatory Visit (INDEPENDENT_AMBULATORY_CARE_PROVIDER_SITE_OTHER): Payer: Federal, State, Local not specified - PPO

## 2017-07-14 DIAGNOSIS — R748 Abnormal levels of other serum enzymes: Secondary | ICD-10-CM

## 2017-07-14 DIAGNOSIS — R7989 Other specified abnormal findings of blood chemistry: Secondary | ICD-10-CM | POA: Diagnosis not present

## 2017-07-14 DIAGNOSIS — R790 Abnormal level of blood mineral: Secondary | ICD-10-CM

## 2017-07-14 LAB — IBC PANEL
Iron: 179 ug/dL — ABNORMAL HIGH (ref 42–165)
Saturation Ratios: 39.7 % (ref 20.0–50.0)
Transferrin: 322 mg/dL (ref 212.0–360.0)

## 2017-07-14 LAB — FERRITIN: Ferritin: 43.4 ng/mL (ref 22.0–322.0)

## 2017-07-17 NOTE — Progress Notes (Signed)
[place] ok My chart message

## 2017-07-17 NOTE — Progress Notes (Signed)
Iron studies ok overall Waiting on hemochromatosis gene test

## 2017-07-18 LAB — HEMOCHROMATOSIS DNA-PCR(C282Y,H63D)

## 2017-07-19 ENCOUNTER — Encounter: Payer: Self-pay | Admitting: Internal Medicine

## 2017-07-23 NOTE — Progress Notes (Signed)
Patient aware through my chart discussion

## 2017-08-10 ENCOUNTER — Encounter: Payer: Self-pay | Admitting: Family Medicine

## 2017-08-10 DIAGNOSIS — E039 Hypothyroidism, unspecified: Secondary | ICD-10-CM

## 2017-08-11 DIAGNOSIS — E039 Hypothyroidism, unspecified: Secondary | ICD-10-CM | POA: Insufficient documentation

## 2017-08-11 NOTE — Telephone Encounter (Signed)
I ordered a TSH. He needs to make a lab appt soon

## 2017-08-12 ENCOUNTER — Other Ambulatory Visit (INDEPENDENT_AMBULATORY_CARE_PROVIDER_SITE_OTHER): Payer: Federal, State, Local not specified - PPO

## 2017-08-12 DIAGNOSIS — E039 Hypothyroidism, unspecified: Secondary | ICD-10-CM | POA: Diagnosis not present

## 2017-08-12 LAB — TSH: TSH: 2.15 u[IU]/mL (ref 0.35–4.50)

## 2017-08-18 NOTE — Telephone Encounter (Signed)
He should actually have another 90 day refill available at his pharmacy. He should call them to refill it

## 2017-08-24 ENCOUNTER — Encounter: Payer: Self-pay | Admitting: Internal Medicine

## 2017-08-27 ENCOUNTER — Encounter: Payer: Self-pay | Admitting: Family Medicine

## 2017-08-27 ENCOUNTER — Encounter: Payer: Self-pay | Admitting: Internal Medicine

## 2017-09-08 ENCOUNTER — Telehealth: Payer: Self-pay | Admitting: Internal Medicine

## 2017-09-08 ENCOUNTER — Encounter: Payer: Self-pay | Admitting: Internal Medicine

## 2017-09-09 ENCOUNTER — Other Ambulatory Visit: Payer: Self-pay

## 2017-09-09 ENCOUNTER — Telehealth: Payer: Self-pay

## 2017-09-09 DIAGNOSIS — R7989 Other specified abnormal findings of blood chemistry: Secondary | ICD-10-CM

## 2017-09-09 DIAGNOSIS — R945 Abnormal results of liver function studies: Secondary | ICD-10-CM

## 2017-09-09 NOTE — Telephone Encounter (Signed)
Order placed for US biopsy of liver, spoke to Bartolo in scheduling, order is in work que. The radiology department will review information and contact patient to schedule. I called and lvm for patient to expect a call from hospital to schedule. Also noted on order is HHT possible liver AVM's.

## 2017-09-09 NOTE — Telephone Encounter (Signed)
-----   Message from Gatha Mayer, MD sent at 09/09/2017  8:35 AM EDT ----- Regarding: needs US-guded liver bx This man needs US-guided liver bx arranged with IR  Dx: abnormal LFT's  Please note in the order info that he has HHT (hereditary hemorrhagictelangiectasia) and could have liver AVM's but so far not seen on imaging

## 2017-09-17 ENCOUNTER — Other Ambulatory Visit: Payer: Self-pay

## 2017-09-17 ENCOUNTER — Telehealth: Payer: Self-pay

## 2017-09-17 ENCOUNTER — Telehealth: Payer: Self-pay | Admitting: Internal Medicine

## 2017-09-17 ENCOUNTER — Encounter: Payer: Self-pay | Admitting: Internal Medicine

## 2017-09-17 DIAGNOSIS — R945 Abnormal results of liver function studies: Principal | ICD-10-CM

## 2017-09-17 DIAGNOSIS — K080 Exfoliation of teeth due to systemic causes: Secondary | ICD-10-CM | POA: Diagnosis not present

## 2017-09-17 DIAGNOSIS — R7989 Other specified abnormal findings of blood chemistry: Secondary | ICD-10-CM

## 2017-09-17 NOTE — Telephone Encounter (Signed)
MRI has been scheduled for 09/22/17 4:00 at [facility] Left message for patient to call back  See message below    FW: Planned liver biopsy  Received: Today  Message Contents  Gatha Mayer, MD sent to Marlon Pel, RN        I sent the patient a message that we would do MRI   Please order the MRI without and with Eovist contrast re: abnormal LFT's, evaluate for AVM's in patient with hereditary hemorrhagic telangiectasia   We may need to reschedule the liver biosy   Previous Messages

## 2017-09-17 NOTE — Telephone Encounter (Signed)
See other phone note for details. 

## 2017-09-18 ENCOUNTER — Telehealth: Payer: Self-pay

## 2017-09-18 ENCOUNTER — Other Ambulatory Visit: Payer: Self-pay

## 2017-09-18 DIAGNOSIS — I780 Hereditary hemorrhagic telangiectasia: Secondary | ICD-10-CM

## 2017-09-18 NOTE — Telephone Encounter (Signed)
I was able to add on MR brain w and wo contrast, spoke to Kimball in scheduling. Will send patient a message through [facility] since phones are down.

## 2017-09-18 NOTE — Telephone Encounter (Signed)
I left a very detailed message for the patient and sent a MyChart message.  The phone lines are not working in the building today due to [employer].

## 2017-09-18 NOTE — Telephone Encounter (Signed)
THANKS

## 2017-09-18 NOTE — Telephone Encounter (Signed)
I spoke with the patient and explained the situation.  He will go for MRI on Tues and will wait on biopsy for results.  All questions answered.

## 2017-09-18 NOTE — Telephone Encounter (Signed)
-----   Message from Gatha Mayer, MD sent at 09/18/2017 12:32 PM EDT ----- Regarding: ? add brain MR Given his HHT he can have brain AVM's  If we can please try to add MR brain without and with contrast looking for AVM of brain  Dx would be hereditary hemorrhagic telangiectasia - if its not possible to ad to the MR on this day let me and him know

## 2017-09-22 ENCOUNTER — Ambulatory Visit (HOSPITAL_COMMUNITY)
Admission: RE | Admit: 2017-09-22 | Discharge: 2017-09-22 | Disposition: A | Discharge status: Home / Self Care | Payer: Federal, State, Local not specified - PPO | Source: Ambulatory Visit | Attending: Internal Medicine | Admitting: Internal Medicine

## 2017-09-22 ENCOUNTER — Ambulatory Visit (HOSPITAL_COMMUNITY): Payer: Federal, State, Local not specified - PPO

## 2017-09-22 DIAGNOSIS — R945 Abnormal results of liver function studies: Secondary | ICD-10-CM | POA: Diagnosis not present

## 2017-09-22 DIAGNOSIS — R7989 Other specified abnormal findings of blood chemistry: Secondary | ICD-10-CM | POA: Diagnosis not present

## 2017-09-22 MED ORDER — GADOBENATE DIMEGLUMINE 529 MG/ML IV SOLN
20.0000 mL | Freq: Once | INTRAVENOUS | Status: AC | PRN
  Administered 2017-09-22: 19 mL via INTRAVENOUS

## 2017-09-23 NOTE — Progress Notes (Signed)
My chart message the patient

## 2017-09-23 NOTE — Progress Notes (Signed)
This looks ok Was supposed to also get a brain MR Looks ordered but not done Please sort out Thx

## 2017-09-25 ENCOUNTER — Ambulatory Visit (HOSPITAL_COMMUNITY): Payer: Federal, State, Local not specified - PPO

## 2017-09-25 ENCOUNTER — Ambulatory Visit (HOSPITAL_COMMUNITY): Admission: RE | Admit: 2017-09-25 | Payer: Federal, State, Local not specified - PPO | Source: Ambulatory Visit

## 2017-10-22 DIAGNOSIS — Z886 Allergy status to analgesic agent status: Secondary | ICD-10-CM | POA: Diagnosis not present

## 2017-10-22 DIAGNOSIS — R040 Epistaxis: Secondary | ICD-10-CM | POA: Diagnosis not present

## 2017-10-22 DIAGNOSIS — E860 Dehydration: Secondary | ICD-10-CM | POA: Diagnosis not present

## 2017-10-22 DIAGNOSIS — I780 Hereditary hemorrhagic telangiectasia: Secondary | ICD-10-CM | POA: Diagnosis not present

## 2017-10-22 DIAGNOSIS — D509 Iron deficiency anemia, unspecified: Secondary | ICD-10-CM | POA: Diagnosis not present

## 2017-10-22 DIAGNOSIS — Z6828 Body mass index (BMI) 28.0-28.9, adult: Secondary | ICD-10-CM | POA: Diagnosis not present

## 2017-11-17 ENCOUNTER — Encounter: Payer: Self-pay | Admitting: Family Medicine

## 2017-11-18 ENCOUNTER — Other Ambulatory Visit: Payer: Self-pay | Admitting: *Deleted

## 2017-11-18 DIAGNOSIS — E039 Hypothyroidism, unspecified: Secondary | ICD-10-CM

## 2017-11-18 MED ORDER — LEVOTHYROXINE SODIUM 75 MCG PO TABS: 75.0000 ug | ORAL_TABLET | Freq: Every day | ORAL | 0 refills | Status: DC

## 2017-11-18 NOTE — Telephone Encounter (Signed)
Pt has been scheduled.  °

## 2017-11-23 ENCOUNTER — Other Ambulatory Visit (INDEPENDENT_AMBULATORY_CARE_PROVIDER_SITE_OTHER): Payer: Federal, State, Local not specified - PPO

## 2017-11-23 DIAGNOSIS — E039 Hypothyroidism, unspecified: Secondary | ICD-10-CM | POA: Diagnosis not present

## 2017-11-23 LAB — TSH: TSH: 5.34 u[IU]/mL — ABNORMAL HIGH (ref 0.35–4.50)

## 2017-11-25 ENCOUNTER — Other Ambulatory Visit: Payer: Self-pay

## 2017-11-25 MED ORDER — LEVOTHYROXINE SODIUM 100 MCG PO TABS: 100.0000 ug | ORAL_TABLET | Freq: Every day | ORAL | 3 refills | Status: DC

## 2017-11-25 NOTE — Telephone Encounter (Signed)
Increase dose for thyroid 100 mcg daily. Call in #90 with 3 rf. Recheck a TSH in 90 days. Rx sent

## 2017-12-09 NOTE — Telephone Encounter (Signed)
Done

## 2017-12-10 DIAGNOSIS — K080 Exfoliation of teeth due to systemic causes: Secondary | ICD-10-CM | POA: Diagnosis not present

## 2018-02-26 ENCOUNTER — Encounter: Payer: Self-pay | Admitting: Family Medicine

## 2018-02-26 DIAGNOSIS — E039 Hypothyroidism, unspecified: Secondary | ICD-10-CM

## 2018-03-01 ENCOUNTER — Telehealth: Payer: Self-pay | Admitting: Family Medicine

## 2018-03-01 NOTE — Telephone Encounter (Signed)
Sent to PCP for approval to place orders

## 2018-03-01 NOTE — Telephone Encounter (Signed)
Copied from [place] [id number]. Topic: General - Other >> Mar 01, 2018  1:43 PM Mario Moore wrote: Reason for CRM: Patient requesting thyroid levels to be test, please advise

## 2018-03-03 NOTE — Telephone Encounter (Signed)
Called and spoke with pt. Pt advised and scheduled for an appt tomorrow.

## 2018-03-03 NOTE — Telephone Encounter (Signed)
The order is already in place

## 2018-03-04 ENCOUNTER — Other Ambulatory Visit (INDEPENDENT_AMBULATORY_CARE_PROVIDER_SITE_OTHER): Payer: Federal, State, Local not specified - PPO

## 2018-03-04 DIAGNOSIS — E039 Hypothyroidism, unspecified: Secondary | ICD-10-CM | POA: Diagnosis not present

## 2018-03-04 LAB — TSH: TSH: 0.6 u[IU]/mL (ref 0.35–4.50)

## 2018-03-09 ENCOUNTER — Other Ambulatory Visit: Payer: Self-pay

## 2018-03-11 DIAGNOSIS — M545 Low back pain: Secondary | ICD-10-CM | POA: Diagnosis not present

## 2018-04-02 IMAGING — MR MR ABDOMEN WO/W CM
16 of 18 series · 40 of 48 positions shown · IV contrast (multihance)
Comparison: 07/14/2017 right upper quadrant ultrasound.

CLINICAL DATA: Elevated liver function tests. No evidence of
primary malignancy.

EXAM:
MRI ABDOMEN WITHOUT AND WITH CONTRAST
TECHNIQUE: Multiplanar multisequence MR imaging of the abdomen was performed
both before and after the administration of intravenous contrast.
CONTRAST:  19mL MULTIHANCE GADOBENATE DIMEGLUMINE 529 MG/ML IV SOLN

[Series 3: T2 fat-sat · axial · 5.0mm · 0.78mm/px · z∈[-51,+179]mm · 3 of 47 slices shown]
[im 1/47]
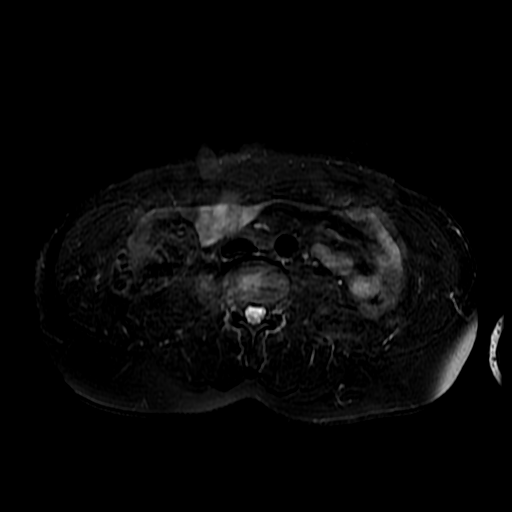
[im 24/47]
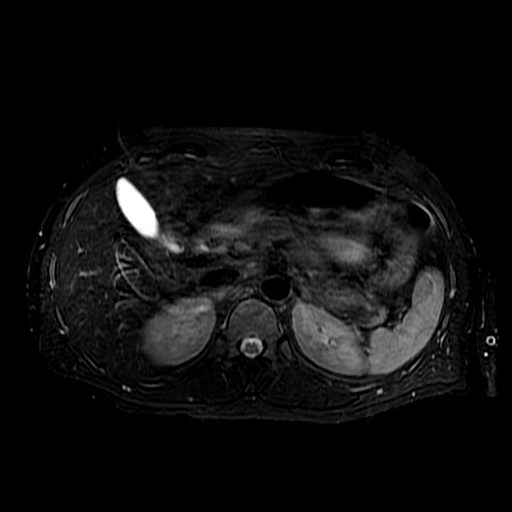
[im 47/47]
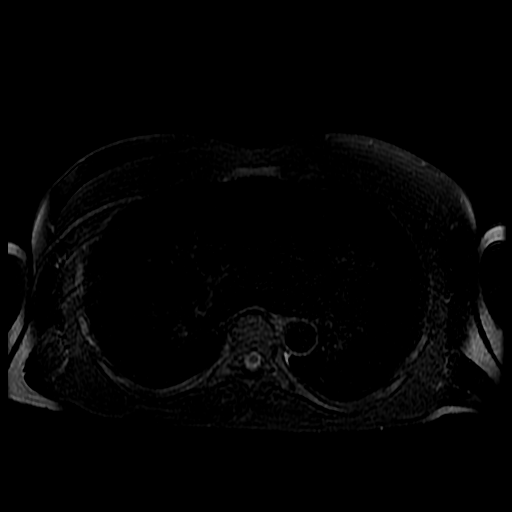

[Series 4: DWI b500 · axial · 6.0mm · 1.48mm/px · z∈[-48,+179]mm · 2 of 60 slices shown]
[im 1/60]
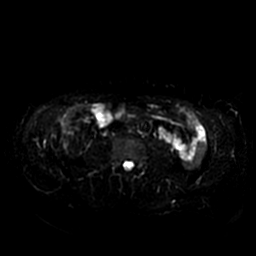
[im 60/60]
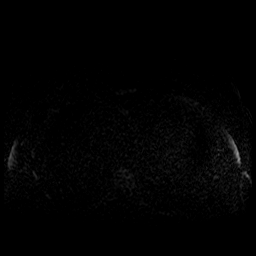

[Series 7: ax dualecho · axial · 5.0mm · 0.78mm/px · z∈[-46,+159]mm · 3 of 84 slices shown]
[im 1/84]
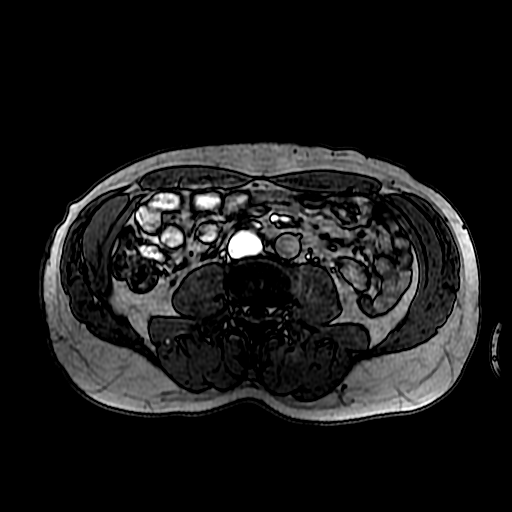
[im 42/84]
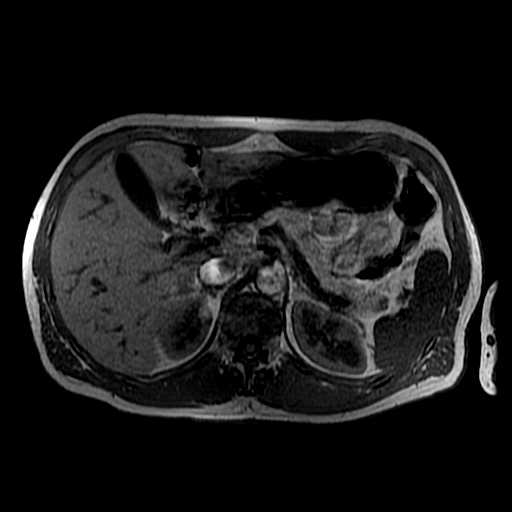
[im 84/84]
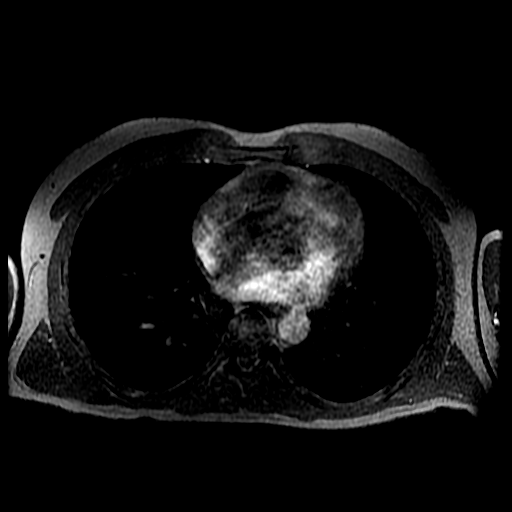

[Series 8: T2 · axial · 5.0mm · 0.78mm/px · z∈[-49,+161]mm · 2 of 43 slices shown (1 of 2)]
[im 1/43]
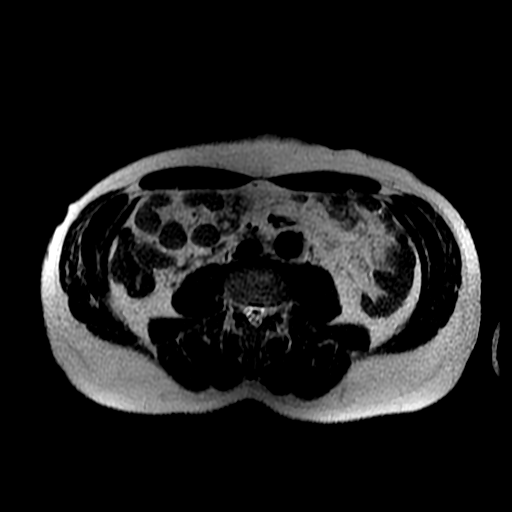
[im 43/43]
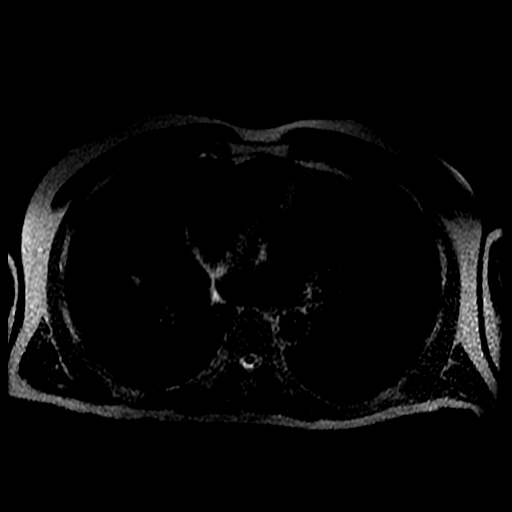

[Series 9: T2 · coronal · 5.0mm · 0.78mm/px · 2 of 43 slices shown (2 of 2)]
[im 1/43]
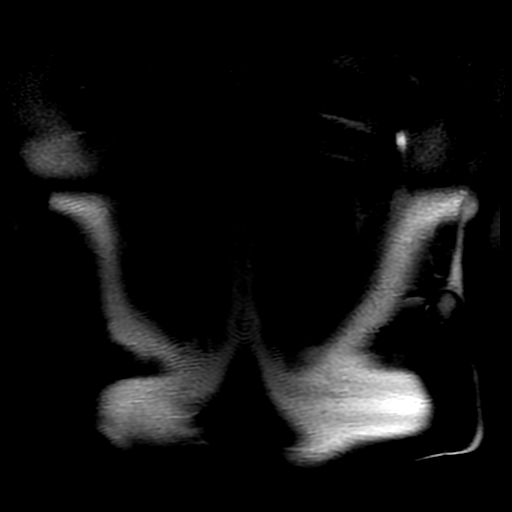
[im 43/43]
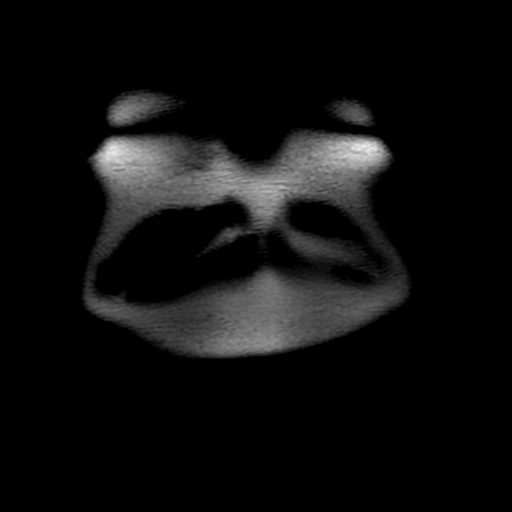

[Series 10: bSSFP · axial · 5.0mm · 1.56mm/px · z∈[-79,+161]mm · 2 of 49 slices shown]
[im 1/49]
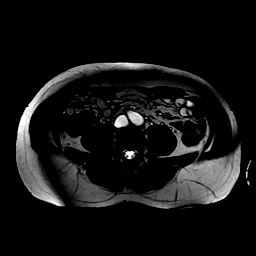
[im 49/49]
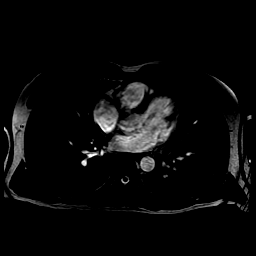

[Series 400: DWI · axial · 6.0mm · 1.48mm/px · 1 of 30 slices shown]
[im 1/30]
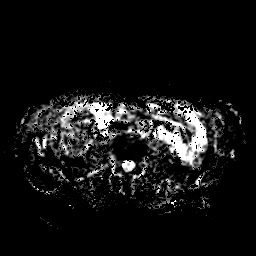

[Series 1100: T1 dynamic · axial · 5.0mm · 1.56mm/px · z∈[-77,+140]mm · 3 of 88 slices shown (1 of 6)]
[im 1/88]
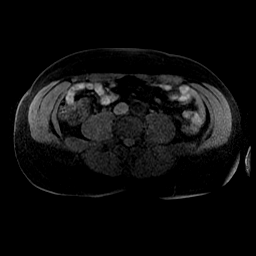
[im 44/88]
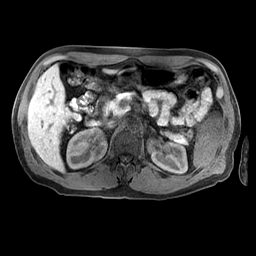
[im 88/88]
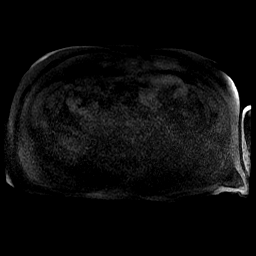

[Series 1101: T1 dynamic · axial · 5.0mm · 1.56mm/px · z∈[-77,+140]mm · 3 of 88 slices shown (2 of 6)]
[im 1/88]
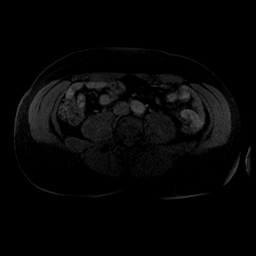
[im 44/88]
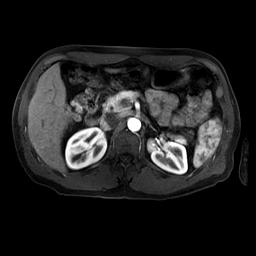
[im 88/88]
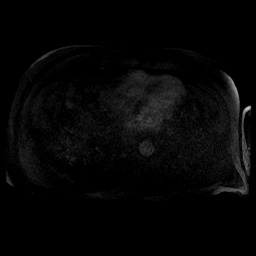

[Series 1102: T1 dynamic · axial · 5.0mm · 1.56mm/px · z∈[-77,+140]mm · 3 of 88 slices shown (3 of 6)]
[im 1/88]
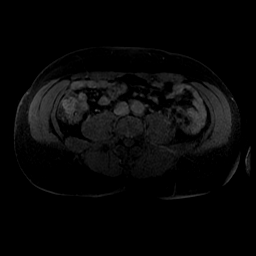
[im 44/88]
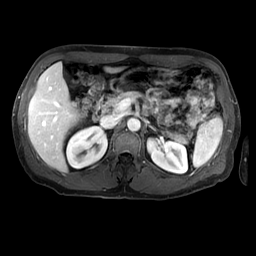
[im 88/88]
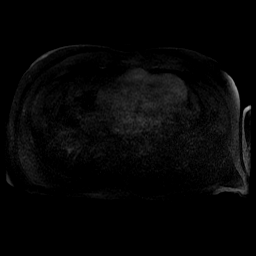

[Series 1103: T1 dynamic · axial · 5.0mm · 1.56mm/px · z∈[-77,+140]mm · 3 of 88 slices shown (4 of 6)]
[im 1/88]
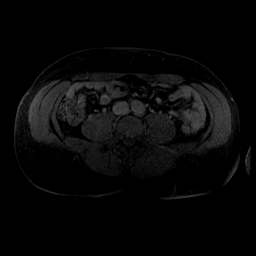
[im 44/88]
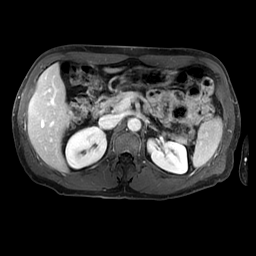
[im 88/88]
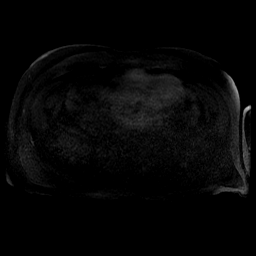

[Series 1104: T1 dynamic · axial · 5.0mm · 1.56mm/px · z∈[-77,+140]mm · 3 of 88 slices shown (5 of 6)]
[im 1/88]
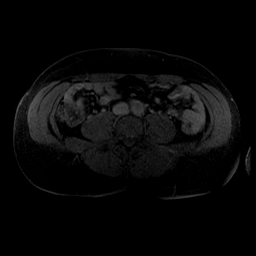
[im 44/88]
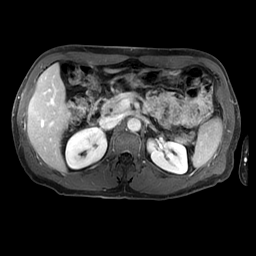
[im 88/88]
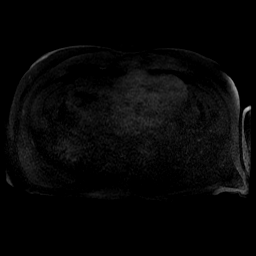

[Series 1105: T1 dynamic · axial · 5.0mm · 1.56mm/px · z∈[-77,+140]mm · 3 of 88 slices shown (6 of 6)]
[im 1/88]
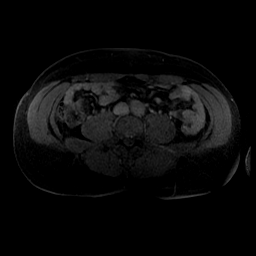
[im 44/88]
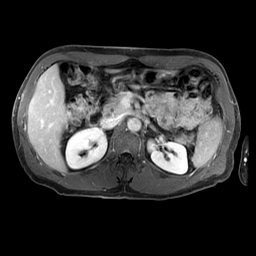
[im 88/88]
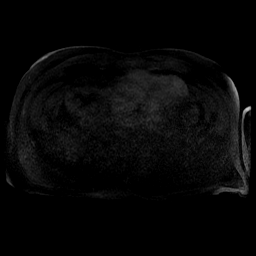

[((id)/(id)/1)-((id)/(id)/1) · axial · 5.0mm · 1.56mm/px · z∈[-77,+140]mm · 3 of 88 slices shown (1 of 3)]
[im 1/88]
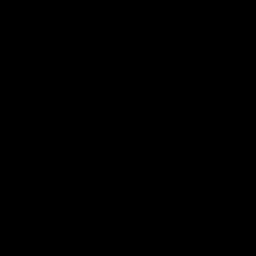
[im 44/88]
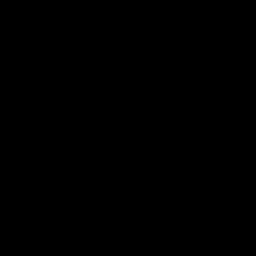
[im 88/88]
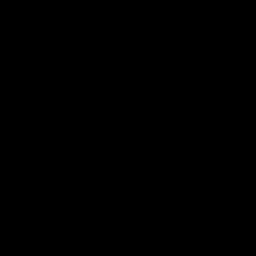

[((id)/(id)/1)-((id)/(id)/1) · axial · 5.0mm · 1.56mm/px · z∈[-77,+140]mm · 3 of 88 slices shown (2 of 3)]
[im 1/88]
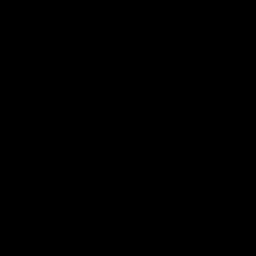
[im 44/88]
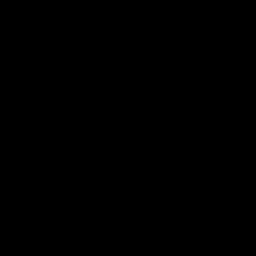
[im 88/88]
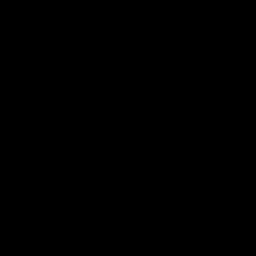

[((id)/(id)/1)-((id)/(id)/1) · axial · 5.0mm · 1.56mm/px · 1 of 88 slices shown (3 of 3)]
[im 1/88]
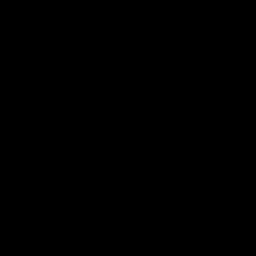

[40 of 48 positions shown; findings below may reference images not displayed]

FINDINGS: Portions of exam are minimally motion degraded.

Lower chest: Normal heart size without pericardial or pleural
effusion.

Hepatobiliary: Normal appearance of the liver. No focal lesion,
intrahepatic duct dilatation, or steatosis. Normal gallbladder,
without common duct dilatation.

Pancreas:  Normal, without mass or ductal dilatation.

Spleen:  Normal in size, without focal abnormality.

Adrenals/Urinary Tract: Normal adrenal glands. A lower pole right
renal subcentimeter cyst. Normal left kidney. No hydronephrosis.

Stomach/Bowel: Normal stomach and abdominal bowel loops.

Vascular/Lymphatic: Normal caliber of the aorta and branch vessels.
No retroperitoneal or retrocrural adenopathy.

Other:  No ascites.

Musculoskeletal: No acute osseous abnormality.
IMPRESSION: No acute process or explanation for elevated liver function tests.

## 2018-06-07 DIAGNOSIS — K080 Exfoliation of teeth due to systemic causes: Secondary | ICD-10-CM | POA: Diagnosis not present

## 2018-08-26 DIAGNOSIS — D500 Iron deficiency anemia secondary to blood loss (chronic): Secondary | ICD-10-CM | POA: Diagnosis not present

## 2018-08-26 DIAGNOSIS — I780 Hereditary hemorrhagic telangiectasia: Secondary | ICD-10-CM | POA: Diagnosis not present

## 2018-08-26 DIAGNOSIS — Z6828 Body mass index (BMI) 28.0-28.9, adult: Secondary | ICD-10-CM | POA: Diagnosis not present

## 2018-08-26 DIAGNOSIS — R040 Epistaxis: Secondary | ICD-10-CM | POA: Diagnosis not present

## 2018-08-30 ENCOUNTER — Encounter: Payer: Self-pay | Admitting: Family Medicine

## 2018-08-31 NOTE — Telephone Encounter (Signed)
Dr. Fry please advise. Thanks  

## 2018-08-31 NOTE — Telephone Encounter (Signed)
We have not seen him in over a year. Make an OV for [place] to evaluate this

## 2018-09-14 ENCOUNTER — Encounter: Payer: Self-pay | Admitting: Family Medicine

## 2018-09-14 ENCOUNTER — Ambulatory Visit: Payer: Federal, State, Local not specified - PPO | Admitting: Family Medicine

## 2018-09-14 VITALS — BP 110/60 | HR 51 | Temp 97.9°F | Wt 206.0 lb

## 2018-09-14 DIAGNOSIS — E039 Hypothyroidism, unspecified: Secondary | ICD-10-CM

## 2018-09-14 DIAGNOSIS — G4700 Insomnia, unspecified: Secondary | ICD-10-CM | POA: Diagnosis not present

## 2018-09-14 DIAGNOSIS — Z8042 Family history of malignant neoplasm of prostate: Secondary | ICD-10-CM

## 2018-09-14 LAB — BASIC METABOLIC PANEL
BUN: 17 mg/dL (ref 6–23)
CO2: 28 mEq/L (ref 19–32)
Calcium: 9.3 mg/dL (ref 8.4–10.5)
Chloride: 104 mEq/L (ref 96–112)
Creatinine, Ser: 0.99 mg/dL (ref 0.40–1.50)
GFR: 85.29 mL/min (ref >60.00)
GLUCOSE: 111 mg/dL — AB (ref 70–99)
POTASSIUM: 3.9 meq/L (ref 3.5–5.1)
Sodium: 140 mEq/L (ref 135–145)

## 2018-09-14 LAB — CBC WITH DIFFERENTIAL/PLATELET
Basophils Absolute: 0 10*3/uL (ref 0.0–0.1)
Basophils Relative: 1.2 % (ref 0.0–3.0)
Eosinophils Absolute: 0.1 10*3/uL (ref 0.0–0.7)
Eosinophils Relative: 3.4 % (ref 0.0–5.0)
HEMATOCRIT: 43.9 % (ref 39.0–52.0)
Hemoglobin: 14.3 g/dL (ref 13.0–17.0)
LYMPHS ABS: 0.7 10*3/uL (ref 0.7–4.0)
LYMPHS PCT: 26.9 % (ref 12.0–46.0)
MCHC: 32.6 g/dL (ref 30.0–36.0)
MCV: 85.7 fl (ref 78.0–100.0)
MONOS PCT: 14.4 % — AB (ref 3.0–12.0)
Monocytes Absolute: 0.4 10*3/uL (ref 0.1–1.0)
Neutro Abs: 1.5 10*3/uL (ref 1.4–7.7)
Neutrophils Relative %: 54.1 % (ref 43.0–77.0)
Platelets: 197 10*3/uL (ref 150.0–400.0)
RBC: 5.12 Mil/uL (ref 4.22–5.81)
RDW: 18.7 % — ABNORMAL HIGH (ref 11.5–15.5)
WBC: 2.8 10*3/uL — ABNORMAL LOW (ref 4.0–10.5)

## 2018-09-14 LAB — LIPID PANEL
CHOL/HDL RATIO: 2
Cholesterol: 159 mg/dL (ref 0–200)
HDL: 69.6 mg/dL (ref >39.00)
LDL Cholesterol: 76 mg/dL (ref 0–99)
NONHDL: 89.05
Triglycerides: 67 mg/dL (ref 0.0–149.0)
VLDL: 13.4 mg/dL (ref 0.0–40.0)

## 2018-09-14 LAB — T3, FREE: T3, Free: 5.7 pg/mL — ABNORMAL HIGH (ref 2.3–4.2)

## 2018-09-14 LAB — TSH: TSH: 1.84 u[IU]/mL (ref 0.35–4.50)

## 2018-09-14 LAB — HEPATIC FUNCTION PANEL
ALBUMIN: 4.3 g/dL (ref 3.5–5.2)
ALK PHOS: 100 U/L (ref 39–117)
ALT: 60 U/L — ABNORMAL HIGH (ref 0–53)
AST: 34 U/L (ref 0–37)
Bilirubin, Direct: 0.1 mg/dL (ref 0.0–0.3)
Total Bilirubin: 0.5 mg/dL (ref 0.2–1.2)
Total Protein: 7 g/dL (ref 6.0–8.3)

## 2018-09-14 LAB — T4, FREE: Free T4: 1.08 ng/dL (ref 0.60–1.60)

## 2018-09-14 LAB — PSA: PSA: 0.33 ng/mL (ref 0.10–4.00)

## 2018-09-14 NOTE — Progress Notes (Signed)
   Subjective:    Patient ID: Mario Moore, male    DOB: 1971/01/21, 47 y.o.   MRN: [id number]  HPI Here asking about insomnia. He had never had trouble with sleeping until several months ago. He may have trouble falling asleep or he may wake up in the middle of the night and lie awake. He denies any stressors in his life. He drinks 2 cups of coffee in the morning but uses no other caffeine the rest of the day. He watches TV before he goes to sleep but he does not use his computer or cell phone at night. He wonders if his thyroid medication could be the culprit. His weight is stable and he feels great.    Review of Systems  Constitutional: Negative.   Respiratory: Negative.   Cardiovascular: Negative.   Endocrine: Negative.   Psychiatric/Behavioral: Positive for sleep disturbance. Negative for dysphoric mood. The patient is not nervous/anxious.        Objective:   Physical Exam  Constitutional: He is oriented to person, place, and time. He appears well-developed and well-nourished.  Neck: No thyromegaly present.  Cardiovascular: Normal rate, regular rhythm, normal heart sounds and intact distal pulses.  Pulmonary/Chest: Effort normal and breath sounds normal.  Neurological: He is alert and oriented to person, place, and time.          Assessment & Plan:  Insomnia. I doubt his thyroid medication is the cause but we will get labs today to investigate. He will try melatonin OTC 5-10 mg prn.  Alysia Penna, MD

## 2018-09-16 NOTE — Addendum Note (Signed)
Addended by: Alysia Penna A on: 09/16/2018 01:59 PM   Modules accepted: Orders

## 2018-09-17 ENCOUNTER — Encounter: Payer: Self-pay | Admitting: Family Medicine

## 2018-09-28 DIAGNOSIS — S335XXA Sprain of ligaments of lumbar spine, initial encounter: Secondary | ICD-10-CM | POA: Diagnosis not present

## 2018-11-08 DIAGNOSIS — M5136 Other intervertebral disc degeneration, lumbar region: Secondary | ICD-10-CM | POA: Diagnosis not present

## 2018-11-08 DIAGNOSIS — M5137 Other intervertebral disc degeneration, lumbosacral region: Secondary | ICD-10-CM | POA: Diagnosis not present

## 2018-11-08 DIAGNOSIS — M21752 Unequal limb length (acquired), left femur: Secondary | ICD-10-CM | POA: Diagnosis not present

## 2018-11-08 DIAGNOSIS — M5441 Lumbago with sciatica, right side: Secondary | ICD-10-CM | POA: Diagnosis not present

## 2018-11-10 DIAGNOSIS — M5137 Other intervertebral disc degeneration, lumbosacral region: Secondary | ICD-10-CM | POA: Diagnosis not present

## 2018-11-10 DIAGNOSIS — M21752 Unequal limb length (acquired), left femur: Secondary | ICD-10-CM | POA: Diagnosis not present

## 2018-11-10 DIAGNOSIS — M5441 Lumbago with sciatica, right side: Secondary | ICD-10-CM | POA: Diagnosis not present

## 2018-11-10 DIAGNOSIS — M5136 Other intervertebral disc degeneration, lumbar region: Secondary | ICD-10-CM | POA: Diagnosis not present

## 2018-11-11 DIAGNOSIS — M21752 Unequal limb length (acquired), left femur: Secondary | ICD-10-CM | POA: Diagnosis not present

## 2018-11-11 DIAGNOSIS — M5137 Other intervertebral disc degeneration, lumbosacral region: Secondary | ICD-10-CM | POA: Diagnosis not present

## 2018-11-11 DIAGNOSIS — M5136 Other intervertebral disc degeneration, lumbar region: Secondary | ICD-10-CM | POA: Diagnosis not present

## 2018-11-11 DIAGNOSIS — M5441 Lumbago with sciatica, right side: Secondary | ICD-10-CM | POA: Diagnosis not present

## 2018-11-15 DIAGNOSIS — M5441 Lumbago with sciatica, right side: Secondary | ICD-10-CM | POA: Diagnosis not present

## 2018-11-15 DIAGNOSIS — M5136 Other intervertebral disc degeneration, lumbar region: Secondary | ICD-10-CM | POA: Diagnosis not present

## 2018-11-15 DIAGNOSIS — M5137 Other intervertebral disc degeneration, lumbosacral region: Secondary | ICD-10-CM | POA: Diagnosis not present

## 2018-11-15 DIAGNOSIS — M21752 Unequal limb length (acquired), left femur: Secondary | ICD-10-CM | POA: Diagnosis not present

## 2018-11-17 DIAGNOSIS — M21752 Unequal limb length (acquired), left femur: Secondary | ICD-10-CM | POA: Diagnosis not present

## 2018-11-17 DIAGNOSIS — M5137 Other intervertebral disc degeneration, lumbosacral region: Secondary | ICD-10-CM | POA: Diagnosis not present

## 2018-11-17 DIAGNOSIS — M5136 Other intervertebral disc degeneration, lumbar region: Secondary | ICD-10-CM | POA: Diagnosis not present

## 2018-11-17 DIAGNOSIS — M5441 Lumbago with sciatica, right side: Secondary | ICD-10-CM | POA: Diagnosis not present

## 2018-11-18 DIAGNOSIS — M21752 Unequal limb length (acquired), left femur: Secondary | ICD-10-CM | POA: Diagnosis not present

## 2018-11-18 DIAGNOSIS — M5441 Lumbago with sciatica, right side: Secondary | ICD-10-CM | POA: Diagnosis not present

## 2018-11-18 DIAGNOSIS — M5137 Other intervertebral disc degeneration, lumbosacral region: Secondary | ICD-10-CM | POA: Diagnosis not present

## 2018-11-18 DIAGNOSIS — M5136 Other intervertebral disc degeneration, lumbar region: Secondary | ICD-10-CM | POA: Diagnosis not present

## 2018-11-22 DIAGNOSIS — M5137 Other intervertebral disc degeneration, lumbosacral region: Secondary | ICD-10-CM | POA: Diagnosis not present

## 2018-11-22 DIAGNOSIS — M5441 Lumbago with sciatica, right side: Secondary | ICD-10-CM | POA: Diagnosis not present

## 2018-11-22 DIAGNOSIS — M21752 Unequal limb length (acquired), left femur: Secondary | ICD-10-CM | POA: Diagnosis not present

## 2018-11-22 DIAGNOSIS — M5136 Other intervertebral disc degeneration, lumbar region: Secondary | ICD-10-CM | POA: Diagnosis not present

## 2018-11-23 ENCOUNTER — Encounter: Payer: Self-pay | Admitting: Family Medicine

## 2018-11-23 MED ORDER — LEVOTHYROXINE SODIUM 100 MCG PO TABS: 100.0000 ug | ORAL_TABLET | Freq: Every day | ORAL | 3 refills | Status: DC

## 2018-11-24 DIAGNOSIS — M5137 Other intervertebral disc degeneration, lumbosacral region: Secondary | ICD-10-CM | POA: Diagnosis not present

## 2018-11-24 DIAGNOSIS — M5136 Other intervertebral disc degeneration, lumbar region: Secondary | ICD-10-CM | POA: Diagnosis not present

## 2018-11-24 DIAGNOSIS — M5441 Lumbago with sciatica, right side: Secondary | ICD-10-CM | POA: Diagnosis not present

## 2018-11-24 DIAGNOSIS — M21752 Unequal limb length (acquired), left femur: Secondary | ICD-10-CM | POA: Diagnosis not present

## 2018-12-22 DIAGNOSIS — K080 Exfoliation of teeth due to systemic causes: Secondary | ICD-10-CM | POA: Diagnosis not present

## 2018-12-23 DIAGNOSIS — M5137 Other intervertebral disc degeneration, lumbosacral region: Secondary | ICD-10-CM | POA: Diagnosis not present

## 2018-12-23 DIAGNOSIS — M21752 Unequal limb length (acquired), left femur: Secondary | ICD-10-CM | POA: Diagnosis not present

## 2018-12-23 DIAGNOSIS — M5441 Lumbago with sciatica, right side: Secondary | ICD-10-CM | POA: Diagnosis not present

## 2018-12-23 DIAGNOSIS — M5136 Other intervertebral disc degeneration, lumbar region: Secondary | ICD-10-CM | POA: Diagnosis not present

## 2018-12-24 DIAGNOSIS — M21752 Unequal limb length (acquired), left femur: Secondary | ICD-10-CM | POA: Diagnosis not present

## 2018-12-24 DIAGNOSIS — M5441 Lumbago with sciatica, right side: Secondary | ICD-10-CM | POA: Diagnosis not present

## 2018-12-24 DIAGNOSIS — M5137 Other intervertebral disc degeneration, lumbosacral region: Secondary | ICD-10-CM | POA: Diagnosis not present

## 2018-12-24 DIAGNOSIS — M5136 Other intervertebral disc degeneration, lumbar region: Secondary | ICD-10-CM | POA: Diagnosis not present

## 2018-12-27 DIAGNOSIS — M21752 Unequal limb length (acquired), left femur: Secondary | ICD-10-CM | POA: Diagnosis not present

## 2018-12-27 DIAGNOSIS — M5441 Lumbago with sciatica, right side: Secondary | ICD-10-CM | POA: Diagnosis not present

## 2018-12-27 DIAGNOSIS — M5136 Other intervertebral disc degeneration, lumbar region: Secondary | ICD-10-CM | POA: Diagnosis not present

## 2018-12-27 DIAGNOSIS — M5137 Other intervertebral disc degeneration, lumbosacral region: Secondary | ICD-10-CM | POA: Diagnosis not present

## 2018-12-29 DIAGNOSIS — M5441 Lumbago with sciatica, right side: Secondary | ICD-10-CM | POA: Diagnosis not present

## 2018-12-29 DIAGNOSIS — M5137 Other intervertebral disc degeneration, lumbosacral region: Secondary | ICD-10-CM | POA: Diagnosis not present

## 2018-12-29 DIAGNOSIS — M5136 Other intervertebral disc degeneration, lumbar region: Secondary | ICD-10-CM | POA: Diagnosis not present

## 2018-12-29 DIAGNOSIS — M21752 Unequal limb length (acquired), left femur: Secondary | ICD-10-CM | POA: Diagnosis not present

## 2019-01-05 DIAGNOSIS — M5137 Other intervertebral disc degeneration, lumbosacral region: Secondary | ICD-10-CM | POA: Diagnosis not present

## 2019-01-05 DIAGNOSIS — M5136 Other intervertebral disc degeneration, lumbar region: Secondary | ICD-10-CM | POA: Diagnosis not present

## 2019-01-05 DIAGNOSIS — M5441 Lumbago with sciatica, right side: Secondary | ICD-10-CM | POA: Diagnosis not present

## 2019-01-05 DIAGNOSIS — M21752 Unequal limb length (acquired), left femur: Secondary | ICD-10-CM | POA: Diagnosis not present

## 2019-01-17 DIAGNOSIS — D2262 Melanocytic nevi of left upper limb, including shoulder: Secondary | ICD-10-CM | POA: Diagnosis not present

## 2019-01-17 DIAGNOSIS — D1801 Hemangioma of skin and subcutaneous tissue: Secondary | ICD-10-CM | POA: Diagnosis not present

## 2019-01-17 DIAGNOSIS — D2261 Melanocytic nevi of right upper limb, including shoulder: Secondary | ICD-10-CM | POA: Diagnosis not present

## 2019-01-17 DIAGNOSIS — L570 Actinic keratosis: Secondary | ICD-10-CM | POA: Diagnosis not present

## 2019-01-17 DIAGNOSIS — D225 Melanocytic nevi of trunk: Secondary | ICD-10-CM | POA: Diagnosis not present

## 2019-05-31 ENCOUNTER — Other Ambulatory Visit: Payer: Self-pay

## 2019-05-31 ENCOUNTER — Encounter: Payer: Self-pay | Admitting: Internal Medicine

## 2019-05-31 ENCOUNTER — Ambulatory Visit (INDEPENDENT_AMBULATORY_CARE_PROVIDER_SITE_OTHER): Payer: Federal, State, Local not specified - PPO | Admitting: Family Medicine

## 2019-05-31 ENCOUNTER — Encounter: Payer: Self-pay | Admitting: Family Medicine

## 2019-05-31 VITALS — BP 108/72 | HR 58 | Temp 98.4°F | Ht 72.0 in | Wt 206.2 lb

## 2019-05-31 DIAGNOSIS — D649 Anemia, unspecified: Secondary | ICD-10-CM

## 2019-05-31 DIAGNOSIS — E538 Deficiency of other specified B group vitamins: Secondary | ICD-10-CM | POA: Diagnosis not present

## 2019-05-31 DIAGNOSIS — Z0000 Encounter for general adult medical examination without abnormal findings: Secondary | ICD-10-CM | POA: Diagnosis not present

## 2019-05-31 DIAGNOSIS — E039 Hypothyroidism, unspecified: Secondary | ICD-10-CM

## 2019-05-31 LAB — CBC WITH DIFFERENTIAL/PLATELET
Basophils Absolute: 0.1 10*3/uL (ref 0.0–0.1)
Basophils Relative: 2 % (ref 0.0–3.0)
Eosinophils Absolute: 0.2 10*3/uL (ref 0.0–0.7)
Eosinophils Relative: 7.8 % — ABNORMAL HIGH (ref 0.0–5.0)
HCT: 31.6 % — ABNORMAL LOW (ref 39.0–52.0)
Hemoglobin: 9.6 g/dL — ABNORMAL LOW (ref 13.0–17.0)
Lymphocytes Relative: 27.5 % (ref 12.0–46.0)
Lymphs Abs: 0.7 10*3/uL (ref 0.7–4.0)
MCHC: 30.4 g/dL (ref 30.0–36.0)
MCV: 68.1 fl — ABNORMAL LOW (ref 78.0–100.0)
Monocytes Absolute: 0.4 10*3/uL (ref 0.1–1.0)
Monocytes Relative: 14.3 % — ABNORMAL HIGH (ref 3.0–12.0)
Neutro Abs: 1.3 10*3/uL — ABNORMAL LOW (ref 1.4–7.7)
Neutrophils Relative %: 48.4 % (ref 43.0–77.0)
Platelets: 243 10*3/uL (ref 150.0–400.0)
RBC: 4.64 Mil/uL (ref 4.22–5.81)
RDW: 17.5 % — ABNORMAL HIGH (ref 11.5–15.5)
WBC: 2.7 10*3/uL — ABNORMAL LOW (ref 4.0–10.5)

## 2019-05-31 LAB — POC URINALSYSI DIPSTICK (AUTOMATED)
Bilirubin, UA: NEGATIVE
Blood, UA: NEGATIVE
Glucose, UA: NEGATIVE
Ketones, UA: NEGATIVE
Leukocytes, UA: NEGATIVE
Nitrite, UA: NEGATIVE
Protein, UA: NEGATIVE
Spec Grav, UA: 1.025 (ref 1.010–1.025)
Urobilinogen, UA: 0.2 E.U./dL
pH, UA: 6 (ref 5.0–8.0)

## 2019-05-31 LAB — LIPID PANEL
Cholesterol: 149 mg/dL (ref 0–200)
HDL: 73.9 mg/dL (ref >39.00)
LDL Cholesterol: 66 mg/dL (ref 0–99)
NonHDL: 75.44
Total CHOL/HDL Ratio: 2
Triglycerides: 47 mg/dL (ref 0.0–149.0)
VLDL: 9.4 mg/dL (ref 0.0–40.0)

## 2019-05-31 LAB — TSH: TSH: 0.87 u[IU]/mL (ref 0.35–4.50)

## 2019-05-31 LAB — BASIC METABOLIC PANEL
BUN: 16 mg/dL (ref 6–23)
CO2: 29 mEq/L (ref 19–32)
Calcium: 9.1 mg/dL (ref 8.4–10.5)
Chloride: 103 mEq/L (ref 96–112)
Creatinine, Ser: 0.87 mg/dL (ref 0.40–1.50)
GFR: 92.88 mL/min (ref >60.00)
Glucose, Bld: 90 mg/dL (ref 70–99)
Potassium: 4.1 mEq/L (ref 3.5–5.1)
Sodium: 137 mEq/L (ref 135–145)

## 2019-05-31 LAB — HEPATIC FUNCTION PANEL
ALT: 91 U/L — ABNORMAL HIGH (ref 0–53)
AST: 49 U/L — ABNORMAL HIGH (ref 0–37)
Albumin: 4.4 g/dL (ref 3.5–5.2)
Alkaline Phosphatase: 122 U/L — ABNORMAL HIGH (ref 39–117)
Bilirubin, Direct: 0.2 mg/dL (ref 0.0–0.3)
Total Bilirubin: 0.9 mg/dL (ref 0.2–1.2)
Total Protein: 6.7 g/dL (ref 6.0–8.3)

## 2019-05-31 LAB — T3, FREE: T3, Free: 5 pg/mL — ABNORMAL HIGH (ref 2.3–4.2)

## 2019-05-31 LAB — VITAMIN B12: Vitamin B-12: 393 pg/mL (ref 211–911)

## 2019-05-31 LAB — PSA: PSA: 0.34 ng/mL (ref 0.10–4.00)

## 2019-05-31 LAB — T4, FREE: Free T4: 1.03 ng/dL (ref 0.60–1.60)

## 2019-05-31 MED ORDER — TEMAZEPAM 30 MG PO CAPS: 30.0000 mg | ORAL_CAPSULE | Freq: Every evening | ORAL | 2 refills | Status: DC | PRN

## 2019-05-31 NOTE — Progress Notes (Signed)
   Subjective:    Patient ID: Mario Moore, male    DOB: April 01, 1971, 48 y.o.   MRN: [id number]  HPI Here for a well exam. He feels well. He still has trouble sleeping however and OTC melatonin did not help.    Review of Systems  Constitutional: Negative.   HENT: Negative.   Eyes: Negative.   Respiratory: Negative.   Cardiovascular: Negative.   Gastrointestinal: Negative.   Genitourinary: Negative.   Musculoskeletal: Negative.   Skin: Negative.   Neurological: Negative.   Psychiatric/Behavioral: Negative.        Objective:   Physical Exam Constitutional:      General: He is not in acute distress.    Appearance: He is well-developed. He is not diaphoretic.  HENT:     Head: Normocephalic and atraumatic.     Right Ear: External ear normal.     Left Ear: External ear normal.     Nose: Nose normal.     Mouth/Throat:     Pharynx: No oropharyngeal exudate.  Eyes:     General: No scleral icterus.       Right eye: No discharge.        Left eye: No discharge.     Conjunctiva/sclera: Conjunctivae normal.     Pupils: Pupils are equal, round, and reactive to light.  Neck:     Musculoskeletal: Neck supple.     Thyroid: No thyromegaly.     Vascular: No JVD.     Trachea: No tracheal deviation.  Cardiovascular:     Rate and Rhythm: Normal rate and regular rhythm.     Heart sounds: Normal heart sounds. No murmur. No friction rub. No gallop.   Pulmonary:     Effort: Pulmonary effort is normal. No respiratory distress.     Breath sounds: Normal breath sounds. No wheezing or rales.  Chest:     Chest wall: No tenderness.  Abdominal:     General: Bowel sounds are normal. There is no distension.     Palpations: Abdomen is soft. There is no mass.     Tenderness: There is no abdominal tenderness. There is no guarding or rebound.  Genitourinary:    Penis: Normal. No tenderness.      Prostate: Normal.     Rectum: Normal. Guaiac result negative.  Musculoskeletal: Normal range of  motion.        General: No tenderness.  Lymphadenopathy:     Cervical: No cervical adenopathy.  Skin:    General: Skin is warm and dry.     Coloration: Skin is not pale.     Findings: No erythema or rash.  Neurological:     Mental Status: He is alert and oriented to person, place, and time.     Cranial Nerves: No cranial nerve deficit.     Motor: No abnormal muscle tone.     Coordination: Coordination normal.     Deep Tendon Reflexes: Reflexes are normal and symmetric. Reflexes normal.  Psychiatric:        Behavior: Behavior normal.        Thought Content: Thought content normal.        Judgment: Judgment normal.           Assessment & Plan:  Well exam. We discussed diet and exercise. Get fasting labs. Try Temazepam as needed for sleep. Set up a colonoscopy.  Alysia Penna, MD

## 2019-06-01 NOTE — Addendum Note (Signed)
Addended by: Alysia Penna A on: 06/01/2019 08:06 AM   Modules accepted: Orders

## 2019-06-02 ENCOUNTER — Encounter: Payer: Self-pay | Admitting: *Deleted

## 2019-06-02 NOTE — Telephone Encounter (Signed)
Dr. Fry please advise. Thanks  

## 2019-06-03 NOTE — Telephone Encounter (Signed)
I agree with him taking 2 iron pills a day, but I still want him to have upper and lower endoscopy because an acute GI bleed could be dangerous

## 2019-06-16 ENCOUNTER — Other Ambulatory Visit: Payer: Self-pay

## 2019-06-16 ENCOUNTER — Ambulatory Visit (AMBULATORY_SURGERY_CENTER): Payer: Self-pay

## 2019-06-16 ENCOUNTER — Encounter: Payer: Self-pay | Admitting: Family Medicine

## 2019-06-16 VITALS — Ht 71.0 in | Wt 203.0 lb

## 2019-06-16 DIAGNOSIS — Z1211 Encounter for screening for malignant neoplasm of colon: Secondary | ICD-10-CM

## 2019-06-16 DIAGNOSIS — D649 Anemia, unspecified: Secondary | ICD-10-CM

## 2019-06-16 NOTE — Telephone Encounter (Signed)
Dr. [name] please advise if he can come in on July 17 for labs.  thanks

## 2019-06-16 NOTE — Progress Notes (Signed)
Per pt, no allergies to soy or egg products.Pt not taking any weight loss meds or using  O2 at home.  Pt states he was hard to wake up past sedation. Pt refused emmi video.   The PV was done over the phone due to COVID-19. Verified pt's address and insurance with pt. Reviewed medical hx and prep instructions with the pt. 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 The pt will call his PCP to get an order for the endoscopy and will reschedule his appointment if necessary. Pt will call back with any questions or changes prior to his procedure.

## 2019-06-16 NOTE — Telephone Encounter (Signed)
I cannot order an upper endoscopy directly but I put in an urgent referral for him to see Dr. Carlean Purl about this. It will then be up to Dr. Carlean Purl as to whether to do this or not

## 2019-06-16 NOTE — Telephone Encounter (Signed)
Dr. Fry please advise. Thanks  

## 2019-06-20 NOTE — Telephone Encounter (Signed)
I put in the lab orders

## 2019-06-23 ENCOUNTER — Telehealth: Payer: Self-pay

## 2019-06-23 DIAGNOSIS — D509 Iron deficiency anemia, unspecified: Secondary | ICD-10-CM

## 2019-06-23 NOTE — Telephone Encounter (Signed)
-----   Message from Gatha Mayer, MD sent at 06/23/2019 10:01 AM EDT ----- Regarding: Add EGD Please add EGD to his colonoscopy and block the 430 next Friday  He needs to come in for CBC and a ferritin also next week  See the My Chart messages  Thanks

## 2019-06-23 NOTE — Telephone Encounter (Signed)
I CANNOT order an upper endoscopy, only the GI doctor can

## 2019-06-23 NOTE — Telephone Encounter (Signed)
Patient notified

## 2019-06-24 ENCOUNTER — Other Ambulatory Visit (INDEPENDENT_AMBULATORY_CARE_PROVIDER_SITE_OTHER): Payer: Federal, State, Local not specified - PPO

## 2019-06-24 DIAGNOSIS — D509 Iron deficiency anemia, unspecified: Secondary | ICD-10-CM | POA: Diagnosis not present

## 2019-06-24 LAB — CBC
HCT: 40.5 % (ref 39.0–52.0)
Hemoglobin: 12.7 g/dL — ABNORMAL LOW (ref 13.0–17.0)
MCHC: 31.4 g/dL (ref 30.0–36.0)
MCV: 76.2 fl — ABNORMAL LOW (ref 78.0–100.0)
Platelets: 224 10*3/uL (ref 150.0–400.0)
RBC: 5.32 Mil/uL (ref 4.22–5.81)
RDW: 28.6 % — ABNORMAL HIGH (ref 11.5–15.5)
WBC: 3 10*3/uL — ABNORMAL LOW (ref 4.0–10.5)

## 2019-06-24 LAB — FERRITIN: Ferritin: 54.4 ng/mL (ref 22.0–322.0)

## 2019-06-24 NOTE — Progress Notes (Signed)
Mario Moore  Labs better  See you next week  CEG

## 2019-06-27 ENCOUNTER — Encounter: Payer: Self-pay | Admitting: Family Medicine

## 2019-06-30 ENCOUNTER — Telehealth: Payer: Self-pay | Admitting: Internal Medicine

## 2019-06-30 NOTE — Telephone Encounter (Signed)

## 2019-07-01 ENCOUNTER — Other Ambulatory Visit: Payer: Self-pay

## 2019-07-01 ENCOUNTER — Encounter: Payer: Self-pay | Admitting: Internal Medicine

## 2019-07-01 ENCOUNTER — Ambulatory Visit (AMBULATORY_SURGERY_CENTER): Payer: Federal, State, Local not specified - PPO | Admitting: Internal Medicine

## 2019-07-01 VITALS — BP 109/67 | HR 43 | Temp 98.6°F | Resp 19 | Ht 71.0 in | Wt 203.0 lb

## 2019-07-01 DIAGNOSIS — D123 Benign neoplasm of transverse colon: Secondary | ICD-10-CM

## 2019-07-01 DIAGNOSIS — I780 Hereditary hemorrhagic telangiectasia: Secondary | ICD-10-CM

## 2019-07-01 DIAGNOSIS — D509 Iron deficiency anemia, unspecified: Secondary | ICD-10-CM

## 2019-07-01 DIAGNOSIS — Z1211 Encounter for screening for malignant neoplasm of colon: Secondary | ICD-10-CM | POA: Diagnosis not present

## 2019-07-01 DIAGNOSIS — D508 Other iron deficiency anemias: Secondary | ICD-10-CM

## 2019-07-01 HISTORY — PX: COLONOSCOPY: SHX174

## 2019-07-01 MED ORDER — SODIUM CHLORIDE 0.9 % IV SOLN: 500.0000 mL | Freq: Once | INTRAVENOUS | Status: DC

## 2019-07-01 NOTE — Patient Instructions (Addendum)
The upper endoscopy was normal. One tiny colon polyp - removed and sent to pathology. I will let you know pathology results and when to have another routine colonoscopy by mail and/or My Chart.   No telangiectasias seen.  Stay on the iron and build back the hemoglobin - recheck it through Dr. [name] or ask me in about 3 months  I appreciate the opportunity to care for you. Gatha Mayer, MD, FACG   YOU HAD AN ENDOSCOPIC PROCEDURE TODAY AT [facility]:   Refer to the procedure report that was given to you for any specific questions about what was found during the examination.  If the procedure report does not answer your questions, please call your gastroenterologist to clarify.  If you requested that your care partner not be given the details of your procedure findings, then the procedure report has been included in a sealed envelope for you to review at your convenience later.  YOU SHOULD EXPECT: Some feelings of bloating in the abdomen. Passage of more gas than usual.  Walking can help get rid of the air that was put into your GI tract during the procedure and reduce the bloating. If you had a lower endoscopy (such as a colonoscopy or flexible sigmoidoscopy) you may notice spotting of blood in your stool or on the toilet paper. If you underwent a bowel prep for your procedure, you may not have a normal bowel movement for a few days.  Please Note:  You might notice some irritation and congestion in your nose or some drainage.  This is from the oxygen used during your procedure.  There is no need for concern and it should clear up in a day or so.  SYMPTOMS TO REPORT IMMEDIATELY:   Following lower endoscopy (colonoscopy or flexible sigmoidoscopy):  Excessive amounts of blood in the stool  Significant tenderness or worsening of abdominal pains  Swelling of the abdomen that is new, acute  Fever of 100F or higher   Following upper endoscopy (EGD)  Vomiting of blood  or coffee ground material  New chest pain or pain under the shoulder blades  Painful or persistently difficult swallowing  New shortness of breath  Fever of 100F or higher  Black, tarry-looking stools  For urgent or emergent issues, a gastroenterologist can be reached at any hour by calling [phone].   DIET:  We do recommend a small meal at first, but then you may proceed to your regular diet.  Drink plenty of fluids but you should avoid alcoholic beverages for 24 hours.  ACTIVITY:  You should plan to take it easy for the rest of today and you should NOT DRIVE or use heavy machinery until tomorrow (because of the sedation medicines used during the test).    FOLLOW UP: Our staff will call the number listed on your records 48-72 hours following your procedure to check on you and address any questions or concerns that you may have regarding the information given to you following your procedure. If we do not reach you, we will leave a message.  We will attempt to reach you two times.  During this call, we will ask if you have developed any symptoms of COVID 19. If you develop any symptoms (ie: fever, flu-like symptoms, shortness of breath, cough etc.) before then, please call [phone].  If you test positive for Covid 19 in the 2 weeks post procedure, please call and report this information to [place].    If  any biopsies were taken you will be contacted by phone or by letter within the next 1-3 weeks.  Please call us at [phone] if you have not heard about the biopsies in 3 weeks.    SIGNATURES/CONFIDENTIALITY: You and/or your care partner have signed paperwork which will be entered into your electronic medical record.  These signatures attest to the fact that that the information above on your After Visit Summary has been reviewed and is understood.  Full responsibility of the confidentiality of this discharge information lies with you and/or your care-partner.

## 2019-07-01 NOTE — Progress Notes (Signed)
Called to room to assist during endoscopic procedure.  Patient ID and intended procedure confirmed with present staff. Received instructions for my participation in the procedure from the performing physician.  

## 2019-07-01 NOTE — Progress Notes (Signed)
[place]

## 2019-07-01 NOTE — Progress Notes (Signed)
PT taken to PACU. Monitors in place. VSS. Report given to RN. 

## 2019-07-01 NOTE — Op Note (Signed)
[facility] Patient Name: Mario Moore Procedure Date: 07/01/2019 2:12 PM MRN: [id number] Endoscopist: Gatha Mayer , MD Age: 48 Referring MD:  Date of Birth: 09/24/1971 Gender: Male Account #: [id number] Procedure:                Colonoscopy Indications:              Screening for colorectal malignant neoplasm Medicines:                Propofol per Anesthesia, Monitored Anesthesia Care Procedure:                Pre-Anesthesia Assessment:                           - Prior to the procedure, a History and Physical                            was performed, and patient medications and                            allergies were reviewed. The patient's tolerance of                            previous anesthesia was also reviewed. The risks                            and benefits of the procedure and the sedation                            options and risks were discussed with the patient.                            All questions were answered, and informed consent                            was obtained. Prior Anticoagulants: The patient has                            taken no previous anticoagulant or antiplatelet                            agents. ASA Grade Assessment: II - A patient with                            mild systemic disease. After reviewing the risks                            and benefits, the patient was deemed in                            satisfactory condition to undergo the procedure.                           After obtaining informed consent, the colonoscope  was passed under direct vision. Throughout the                            procedure, the patient's blood pressure, pulse, and                            oxygen saturations were monitored continuously. The                            Colonoscope was introduced through the anus and                            advanced to the the cecum, identified by   appendiceal orifice and ileocecal valve. The                            colonoscopy was performed without difficulty. The                            patient tolerated the procedure well. The quality                            of the bowel preparation was excellent. The                            ileocecal valve, appendiceal orifice, and rectum                            were photographed. The bowel preparation used was                            Miralax via split dose instruction. Scope In: 2:26:12 PM Scope Out: 2:41:41 PM Scope Withdrawal Time: 0 hours 12 minutes 6 seconds  Total Procedure Duration: 0 hours 15 minutes 29 seconds  Findings:                 The perianal and digital rectal examinations were                            normal. Pertinent negatives include normal prostate                            (size, shape, and consistency).                           A diminutive polyp was found in the transverse                            colon. The polyp was sessile. The polyp was removed                            with a cold snare. Resection and retrieval were                            complete. Verification  of patient identification                            for the specimen was done. Estimated blood loss was                            minimal.                           The exam was otherwise without abnormality on                            direct and retroflexion views. Complications:            No immediate complications. Estimated Blood Loss:     Estimated blood loss was minimal. Impression:               - One diminutive polyp in the transverse colon,                            removed with a cold snare. Resected and retrieved.                           - The examination was otherwise normal on direct                            and retroflexion views. 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 The signs and symptoms of potential                            delayed complications were discussed with the                            patient. Return to normal activities tomorrow.                            Written discharge instructions were provided to the                            patient.                           - Resume previous diet.                           - Continue present medications.                           -  Repeat colonoscopy is recommended. The                            colonoscopy date will be determined after pathology                            results from today's exam become available for                            review. Gatha Mayer, MD 07/01/2019 2:55:39 PM This report has been signed electronically.

## 2019-07-01 NOTE — Op Note (Signed)
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 The patient's tolerance of                            previous anesthesia was also reviewed. The risks                            and benefits of the procedure and the sedation                            options and risks were discussed with the patient.                            All questions were answered, and informed consent                            was obtained. Prior Anticoagulants: The patient has                            taken no previous anticoagulant or antiplatelet                            agents. ASA Grade Assessment: II - A patient with                            mild systemic disease. After reviewing the risks  and benefits, the patient was deemed in                            satisfactory condition to undergo the procedure.                           After obtaining informed consent, the endoscope was                            passed under direct vision. Throughout the                            procedure, the patient's blood pressure, pulse, and                            oxygen saturations were  monitored continuously. The                            Endoscope was introduced through the mouth, and                            advanced to the second part of duodenum. The upper                            GI endoscopy was accomplished without difficulty.                            The patient tolerated the procedure well. Scope In: Scope Out: Findings:                 The esophagus was normal.                           The stomach was normal.                           The examined duodenum was normal.                           The cardia and gastric fundus were normal on                            retroflexion. Complications:            No immediate complications. Estimated Blood Loss:     Estimated blood loss: none. Impression:               - Normal esophagus.                           - Normal stomach.                           - Normal examined duodenum.                           - No specimens collected. NO TELANGIECTASIA HERE -  SEEMS LIKE EPISTAXIS IS CAUSE OF ANEMIA - HE WAS                            FINE UNTIL HE STOPPED IRON SUPPLEMENTS - HE IS BACK                            ON Recommendation:           - Patient has a contact number available for                            emergencies. The signs and symptoms of potential                            delayed complications were discussed with the                            patient. Return to normal activities tomorrow.                            Written discharge instructions were provided to the                            patient.                           - Resume previous diet.                           - Continue present medications.                           - See the other procedure note for documentation of                            additional recommendations. Gatha Mayer, MD 07/01/2019 2:53:04 PM This report has been signed electronically.

## 2019-07-05 ENCOUNTER — Telehealth: Payer: Self-pay

## 2019-07-05 NOTE — Telephone Encounter (Signed)
First attempt call back after procedure, left message to call if problems, but otherwise we will check back later.

## 2019-07-05 NOTE — Telephone Encounter (Signed)
  Follow up Call-  Call back number 07/01/2019  Post procedure Call Back phone  # [phone]  Permission to leave phone message Yes  Some recent data might be hidden     Patient questions:  Do you have a fever, pain , or abdominal swelling? No. Pain Score  0 *  Have you tolerated food without any problems? Yes.    Have you been able to return to your normal activities? Yes.    Do you have any questions about your discharge instructions: Diet   No. Medications  No. Follow up visit  No.  Do you have questions or concerns about your Care? No.  Actions: * If pain score is 4 or above: No action needed, pain <4.  1. Have you developed a fever since your procedure? no  2.   Have you had an respiratory symptoms (SOB or cough) since your procedure? no  3.   Have you tested positive for COVID 19 since your procedure no  4.   Have you had any family members/close contacts diagnosed with the COVID 19 since your procedure?  no   If yes to any of these questions please route to Joylene John, RN and Alphonsa Gin, Therapist, sports.

## 2019-07-06 ENCOUNTER — Encounter: Payer: Self-pay | Admitting: Internal Medicine

## 2019-07-06 DIAGNOSIS — Z860101 Personal history of adenomatous and serrated colon polyps: Secondary | ICD-10-CM | POA: Insufficient documentation

## 2019-07-06 DIAGNOSIS — Z86010 Personal history of colonic polyps: Secondary | ICD-10-CM

## 2019-07-06 HISTORY — DX: Personal history of colonic polyps: Z86.010

## 2019-07-06 HISTORY — DX: Personal history of adenomatous and serrated colon polyps: Z86.0101

## 2019-07-06 NOTE — Progress Notes (Signed)
Diminutive adenoma Recall 2027 My Chart

## 2019-09-09 ENCOUNTER — Other Ambulatory Visit: Payer: Self-pay | Admitting: Family Medicine

## 2019-09-12 ENCOUNTER — Encounter: Payer: Self-pay | Admitting: Family Medicine

## 2019-09-12 NOTE — Telephone Encounter (Signed)
Ok for refill? 

## 2019-09-12 NOTE — Telephone Encounter (Signed)
Please advise 

## 2019-09-16 NOTE — Telephone Encounter (Signed)
I refilled this 3 days ago.

## 2019-11-29 ENCOUNTER — Encounter: Payer: Self-pay | Admitting: Family Medicine

## 2019-11-30 MED ORDER — LEVOTHYROXINE SODIUM 100 MCG PO TABS: 100.0000 ug | ORAL_TABLET | Freq: Every day | ORAL | 0 refills | Status: DC

## 2019-12-19 DIAGNOSIS — I780 Hereditary hemorrhagic telangiectasia: Secondary | ICD-10-CM | POA: Diagnosis not present

## 2019-12-19 DIAGNOSIS — R040 Epistaxis: Secondary | ICD-10-CM | POA: Diagnosis not present

## 2019-12-19 DIAGNOSIS — D500 Iron deficiency anemia secondary to blood loss (chronic): Secondary | ICD-10-CM | POA: Diagnosis not present

## 2020-01-19 DIAGNOSIS — D2262 Melanocytic nevi of left upper limb, including shoulder: Secondary | ICD-10-CM | POA: Diagnosis not present

## 2020-01-19 DIAGNOSIS — D225 Melanocytic nevi of trunk: Secondary | ICD-10-CM | POA: Diagnosis not present

## 2020-01-19 DIAGNOSIS — L814 Other melanin hyperpigmentation: Secondary | ICD-10-CM | POA: Diagnosis not present

## 2020-01-19 DIAGNOSIS — D2261 Melanocytic nevi of right upper limb, including shoulder: Secondary | ICD-10-CM | POA: Diagnosis not present

## 2020-02-14 DIAGNOSIS — I780 Hereditary hemorrhagic telangiectasia: Secondary | ICD-10-CM | POA: Diagnosis not present

## 2020-02-26 ENCOUNTER — Other Ambulatory Visit: Payer: Self-pay | Admitting: Family Medicine

## 2020-03-12 ENCOUNTER — Other Ambulatory Visit: Payer: Self-pay | Admitting: Family Medicine

## 2020-03-13 MED ORDER — LEVOTHYROXINE SODIUM 100 MCG PO TABS: 100.0000 ug | ORAL_TABLET | Freq: Every day | ORAL | 0 refills | Status: DC

## 2020-03-22 DIAGNOSIS — Z20828 Contact with and (suspected) exposure to other viral communicable diseases: Secondary | ICD-10-CM | POA: Diagnosis not present

## 2020-03-22 DIAGNOSIS — Z03818 Encounter for observation for suspected exposure to other biological agents ruled out: Secondary | ICD-10-CM | POA: Diagnosis not present

## 2020-03-29 ENCOUNTER — Encounter: Payer: Self-pay | Admitting: Family Medicine

## 2020-03-29 DIAGNOSIS — G4730 Sleep apnea, unspecified: Secondary | ICD-10-CM

## 2020-04-02 NOTE — Telephone Encounter (Signed)
I did the referral for a sleep study

## 2020-04-19 ENCOUNTER — Ambulatory Visit: Payer: Federal, State, Local not specified - PPO | Admitting: Internal Medicine

## 2020-04-19 ENCOUNTER — Encounter: Payer: Self-pay | Admitting: Internal Medicine

## 2020-04-19 ENCOUNTER — Other Ambulatory Visit: Payer: Self-pay

## 2020-04-19 VITALS — BP 112/64 | HR 83 | Temp 98.2°F | Ht 71.0 in | Wt 206.6 lb

## 2020-04-19 DIAGNOSIS — R0683 Snoring: Secondary | ICD-10-CM

## 2020-04-19 DIAGNOSIS — G4733 Obstructive sleep apnea (adult) (pediatric): Secondary | ICD-10-CM

## 2020-04-19 DIAGNOSIS — E039 Hypothyroidism, unspecified: Secondary | ICD-10-CM

## 2020-04-19 NOTE — Patient Instructions (Signed)
Order - schedule HST   Dx OSA  Please call me about 2 weeks after your sleep study is done to see if results and recommendations are ready yet. If appropriate, we may be able to start treatment before we see you next.

## 2020-04-19 NOTE — Progress Notes (Signed)
04/19/20-  49 yo M [employer],never smoker for sleep evaluation courtesy of Dr [name]. Medical problem list includes Hereditary Hemorrhagic Telangiectasia, Hypothyroid, Iron Def Anemia,  Lumbar Disc Disease,  Medications include temazepam 30 Body weight today 206 lbs Epworth score 8 Difficulty falling asleep, latency 2-5 hours, waking 2-3 x/ night before up 6:30-7AM. 1-2 cups AM coffee. Temazepam associated with troubling dreams. Dislikes taking meds. 2-3 beers helps.  Wife tells him he snores. Naps easily in the evenings. Works 9-5PM. Sleep study in [place], [place] 20 yrs ago remembered as negative then.  ENT - septoplasty, repair broken nose.  HHT associated particularly with recurrent epistaxis, helped by prescribed cream- potential issue with CPAP.  Father uses CPAP. Mother snores.  Had 2 Phizer Covax.  Prior to Admission medications   Medication Sig Start Date End Date Taking? Authorizing Provider  cetirizine (ZYRTEC) 10 MG tablet Take 10 mg by mouth as needed.    Yes [provider]  Estriol POWD Place 1 application into the nose 2 (two) times daily. Compounded at [facility] 0.1 %/per pt it is a compound drug in a gel 12/19/15  Yes [provider]  ferrous sulfate (IRON SUPPLEMENT) 325 (65 FE) MG tablet Take 325 mg by mouth daily with breakfast.    Yes [provider]  levothyroxine (SYNTHROID) 100 MCG tablet Take 1 tablet (100 mcg total) by mouth daily. 03/13/20  Yes [name], MD  temazepam (RESTORIL) 30 MG capsule TAKE 1 CAPSULE BY MOUTH AT BEDTIME AS NEEDED FOR SLEEP 09/13/19  Yes [name], MD  doxycycline (VIBRAMYCIN) 100 MG capsule Take 1 capsule (100 mg total) by mouth 2 (two) times daily for 10 days. 04/20/20 04/30/20  [name], MD   Past Medical History:  Diagnosis Date  . Allergy    seasonal  . Anemia    due to frequency of nose bleeds- not a problem now-uses OTC iron supplement  . Complication of anesthesia    slow to wake up  after surgery  . History of seasonal allergies    tx. Cetirizine  . Hx of adenomatous polyp of colon 07/06/2019  . Hypothyroidism   . Low back pain    gets acupuncture from Dr. [name] at [facility]  . Personal history of hereditary hemorrhagic telangiectasia (HHT)    sees Dr. [name] at [facility]    Past Surgical History:  Procedure Laterality Date  . COLONOSCOPY    . ESOPHAGOGASTRODUODENOSCOPY    . KNEE ARTHROSCOPY WITH MEDIAL MENISECTOMY Left 01/09/2016   Procedure: LEFT KNEE ARTHROSCOPY WITH MEDIAL DEBRIDEMENT ;  Surgeon: Gaynelle Arabian, MD;  Location: [facility];  Service: Orthopedics;  Laterality: Left;  . LASIK Bilateral 1995  . nasoseptolplasty     has a deviated septum  . VASECTOMY     Family History  Problem Relation Age of Onset  . Prostate cancer Father   . Irritable bowel syndrome Mother   . Osler-Weber-Rendu syndrome Mother        HHT  . Osler-Weber-Rendu syndrome Sister        HHT  . Osler-Weber-Rendu syndrome Maternal Grandfather        HHT   Social History   Socioeconomic History  . Marital status: Married    Spouse name: Not on file  . Number of children: 2  . Years of education: Not on file  . Highest education level: Not on file  Occupational History  . Occupation: [employer]  Tobacco Use  . Smoking status: Never Smoker  . Smokeless tobacco: Never Used  Substance and Sexual Activity  . Alcohol use: Yes    Alcohol/week: 6.0 standard drinks    Types: 6 Cans of beer per week    Comment: beer on weekends -occ.  . Drug use: No  . Sexual activity: Yes  Other Topics Concern  . 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 Difficulty of Paying Living Expenses:   Food Insecurity:   . Worried About Charity fundraiser in the Last Year:   . Arboriculturist  in the Last Year:   Transportation Needs:   . Film/video editor (Medical):   Marland Kitchen Lack of Transportation (Non-Medical):   Physical Activity:   . Days of Exercise per Week:   . Minutes of Exercise per Session:   Stress:   . Feeling of Stress :   Social Connections:   . Frequency of Communication with Friends and Family:   . Frequency of Social Gatherings with Friends and Family:   . Attends Religious Services:   . Active Member of Clubs or Organizations:   . Attends Archivist Meetings:   Marland Kitchen Marital Status:   Intimate Partner Violence:   . Fear of Current or Ex-Partner:   . Emotionally Abused:   Marland Kitchen Physically Abused:   . Sexually Abused:    ROS-see HPI   + = positive Constitutional:    weight loss, night sweats, fevers, chills, fatigue, lassitude. HEENT:    headaches, difficulty swallowing, tooth/dental problems, sore throat,       sneezing, itching, ear ache,+ nasal congestion, post nasal drip, snoring CV:    chest pain, orthopnea, PND, swelling in lower extremities, anasarca,                                  dizziness, palpitations Resp:   shortness of breath with exertion or at rest.                productive cough,   non-productive cough, coughing up of blood.              change in color of mucus.  wheezing.   Skin:    rash or lesions. GI:  No-   heartburn, indigestion, abdominal pain, nausea, vomiting, diarrhea,                 change in bowel habits, loss of appetite GU: dysuria, change in color of urine, no urgency or frequency.   flank pain. MS:   joint pain, stiffness, decreased range of motion, back pain. Neuro-     nothing unusual Psych:  change in mood or affect.  depression or anxiety.   memory loss.  OBJ- Physical Exam General- Alert, Oriented, Affect-appropriate, Distress- none acute, + tall/ lean Skin- rash-none, lesions- none, excoriation- none Lymphadenopathy- none Head- atraumatic            Eyes- Gross vision intact, PERRLA, conjunctivae and  secretions clear            Ears- Hearing, canals-normal            Nose- Clear, no-Septal dev, mucus, polyps, erosion, perforation  Throat- Mallampati III, mucosa clear , drainage- none, tonsils+2, + teeth Neck- flexible , trachea midline, no stridor , thyroid nl, carotid no bruit Chest - symmetrical excursion , unlabored           Heart/CV- RRR , no murmur , no gallop  , no rub, nl s1 s2                           - JVD- none , edema- none, stasis changes- none, varices- none           Lung- clear to P&A, wheeze- none, cough- none , dullness-none, rub- none           Chest wall-  Abd-  Br/ Gen/ Rectal- Not done, not indicated Extrem- cyanosis- none, clubbing, none, atrophy- none, strength- nl Neuro- grossly intact to observation

## 2020-04-20 ENCOUNTER — Encounter: Payer: Self-pay | Admitting: Family Medicine

## 2020-04-20 ENCOUNTER — Ambulatory Visit (INDEPENDENT_AMBULATORY_CARE_PROVIDER_SITE_OTHER): Payer: Federal, State, Local not specified - PPO | Admitting: Family Medicine

## 2020-04-20 VITALS — BP 130/70 | HR 52 | Temp 97.9°F | Wt 204.8 lb

## 2020-04-20 DIAGNOSIS — R319 Hematuria, unspecified: Secondary | ICD-10-CM | POA: Diagnosis not present

## 2020-04-20 DIAGNOSIS — R300 Dysuria: Secondary | ICD-10-CM | POA: Diagnosis not present

## 2020-04-20 DIAGNOSIS — R0683 Snoring: Secondary | ICD-10-CM | POA: Insufficient documentation

## 2020-04-20 DIAGNOSIS — N390 Urinary tract infection, site not specified: Secondary | ICD-10-CM | POA: Diagnosis not present

## 2020-04-20 LAB — POCT URINALYSIS DIPSTICK
Bilirubin, UA: NEGATIVE
Blood, UA: POSITIVE
Glucose, UA: NEGATIVE
Ketones, UA: NEGATIVE
Leukocytes, UA: NEGATIVE
Nitrite, UA: NEGATIVE
Protein, UA: POSITIVE — AB
Spec Grav, UA: >=1.03 — AB (ref 1.010–1.025)
Urobilinogen, UA: 0.2 E.U./dL
pH, UA: 5.5 (ref 5.0–8.0)

## 2020-04-20 MED ORDER — DOXYCYCLINE HYCLATE 100 MG PO CAPS: 100.0000 mg | ORAL_CAPSULE | Freq: Two times a day (BID) | ORAL | 0 refills | Status: AC

## 2020-04-20 NOTE — Assessment & Plan Note (Signed)
Treatment allowed him to normalize weight. Managed elsewhere.

## 2020-04-20 NOTE — Progress Notes (Signed)
   Subjective:    Patient ID: Mario Moore, male    DOB: May 22, 1971, 49 y.o.   MRN: [id number]  HPI Here for 2 weeks of urinary urgency and burning. No fever or back pain. No penile DC. He has never had this before.    Review of Systems  Constitutional: Negative.   Respiratory: Negative.   Cardiovascular: Negative.   Genitourinary: Positive for dysuria, frequency and urgency. Negative for discharge, flank pain, hematuria and testicular pain.       Objective:   Physical Exam Constitutional:      Appearance: Normal appearance.  Cardiovascular:     Rate and Rhythm: Normal rate and regular rhythm.     Pulses: Normal pulses.     Heart sounds: Normal heart sounds.  Pulmonary:     Effort: Pulmonary effort is normal.     Breath sounds: Normal breath sounds.  Abdominal:     General: Abdomen is flat. Bowel sounds are normal. There is no distension.     Palpations: Abdomen is soft. There is no mass.     Tenderness: There is no abdominal tenderness. There is no guarding or rebound.     Hernia: No hernia is present.  Neurological:     Mental Status: He is alert.           Assessment & Plan:  UTI, likely a prostatitis. Treat with Doxycycline. Culture the sample.  Alysia Penna, MD

## 2020-04-20 NOTE — Assessment & Plan Note (Signed)
Probable OSA, based on hx and exam, but unclear if significant. He is not overweight. Appropriate discussion, incl driving safety, evaluation, treatment options for OSA. Hx of epistaxis related to HHT, may be a problem with CPAP airflow. Plan- sleep study. If he needs CPAP, would favor a full-face mask to diffuse nasal airflow.

## 2020-04-21 LAB — URINE CULTURE
MICRO NUMBER:: 10479313
SPECIMEN QUALITY:: ADEQUATE

## 2020-05-01 ENCOUNTER — Telehealth: Payer: Self-pay | Admitting: Internal Medicine

## 2020-05-01 NOTE — Telephone Encounter (Signed)
I had pt's order pulled to call.  Called pt & scheduled hst.  Nothing further needed.

## 2020-05-11 ENCOUNTER — Other Ambulatory Visit: Payer: Self-pay

## 2020-05-11 ENCOUNTER — Ambulatory Visit: Payer: Federal, State, Local not specified - PPO

## 2020-05-11 DIAGNOSIS — G4733 Obstructive sleep apnea (adult) (pediatric): Secondary | ICD-10-CM

## 2020-05-13 DIAGNOSIS — G4733 Obstructive sleep apnea (adult) (pediatric): Secondary | ICD-10-CM | POA: Diagnosis not present

## 2020-05-14 DIAGNOSIS — G4733 Obstructive sleep apnea (adult) (pediatric): Secondary | ICD-10-CM | POA: Diagnosis not present

## 2020-05-15 ENCOUNTER — Other Ambulatory Visit: Payer: Self-pay | Admitting: Family Medicine

## 2020-05-16 NOTE — Telephone Encounter (Signed)
Last filled 09/13/2019 Last OV 04/20/2020  Ok to fill?

## 2020-05-17 ENCOUNTER — Institutional Professional Consult (permissible substitution): Payer: Federal, State, Local not specified - PPO | Admitting: Pulmonary Disease

## 2020-06-11 ENCOUNTER — Other Ambulatory Visit: Payer: Self-pay | Admitting: Family Medicine

## 2020-06-12 NOTE — Telephone Encounter (Signed)
I apologize for the delay. The study was normal. He averaged 1.2 apnea events per hour ( normal is 5/ hour or less). Oxygen level averaged 93%, which is normal. I will be happy to see him back if he wishes, but otherwise we don't need to do anything more medically.

## 2020-06-12 NOTE — Telephone Encounter (Signed)
CY - please advise. Thanks! 

## 2020-06-26 ENCOUNTER — Ambulatory Visit (INDEPENDENT_AMBULATORY_CARE_PROVIDER_SITE_OTHER): Payer: Federal, State, Local not specified - PPO | Admitting: Family Medicine

## 2020-06-26 ENCOUNTER — Encounter: Payer: Self-pay | Admitting: Family Medicine

## 2020-06-26 ENCOUNTER — Other Ambulatory Visit: Payer: Self-pay

## 2020-06-26 VITALS — BP 120/62 | HR 51 | Temp 97.7°F | Ht 71.0 in | Wt 199.6 lb

## 2020-06-26 DIAGNOSIS — Z0000 Encounter for general adult medical examination without abnormal findings: Secondary | ICD-10-CM

## 2020-06-26 DIAGNOSIS — E039 Hypothyroidism, unspecified: Secondary | ICD-10-CM

## 2020-06-26 DIAGNOSIS — G629 Polyneuropathy, unspecified: Secondary | ICD-10-CM | POA: Diagnosis not present

## 2020-06-26 DIAGNOSIS — I780 Hereditary hemorrhagic telangiectasia: Secondary | ICD-10-CM | POA: Diagnosis not present

## 2020-06-26 NOTE — Progress Notes (Signed)
Subjective:    Patient ID: Mario Moore, male    DOB: [birth year], 49 y.o.   MRN: [id number]  HPI Here for a well exam. He feels fine in general but he does mention some intermittent sensations in the right foot that started about 8 weeks ago. He feels numbness or tingling or a cold sensation at times. No pain. No color changes. His hereditary hemorrhagic telangectasia seems to be stable. He has not seen blood in the stool for over a year. Last year he had a brain MRI and an ECHO which were normal. He still takes an iron pill every day.   Review of Systems  Constitutional: Negative.   HENT: Negative.   Eyes: Negative.   Respiratory: Negative.   Cardiovascular: Negative.   Gastrointestinal: Negative.   Genitourinary: Negative.   Musculoskeletal: Negative.   Skin: Negative.   Neurological: Positive for numbness.  Psychiatric/Behavioral: Negative.        Objective:   Physical Exam Constitutional:      General: He is not in acute distress.    Appearance: He is well-developed. He is not diaphoretic.  HENT:     Head: Normocephalic and atraumatic.     Right Ear: External ear normal.     Left Ear: External ear normal.     Nose: Nose normal.     Mouth/Throat:     Pharynx: No oropharyngeal exudate.  Eyes:     General: No scleral icterus.       Right eye: No discharge.        Left eye: No discharge.     Conjunctiva/sclera: Conjunctivae normal.     Pupils: Pupils are equal, round, and reactive to light.  Neck:     Thyroid: No thyromegaly.     Vascular: No JVD.     Trachea: No tracheal deviation.  Cardiovascular:     Rate and Rhythm: Normal rate and regular rhythm.     Heart sounds: Normal heart sounds. No murmur heard.  No friction rub. No gallop.   Pulmonary:     Effort: Pulmonary effort is normal. No respiratory distress.     Breath sounds: Normal breath sounds. No wheezing or rales.  Chest:     Chest wall: No tenderness.  Abdominal:     General: Bowel sounds are  normal. There is no distension.     Palpations: Abdomen is soft. There is no mass.     Tenderness: There is no abdominal tenderness. There is no guarding or rebound.  Genitourinary:    Penis: Normal. No tenderness.      Testes: Normal.     Prostate: Normal.     Rectum: Normal. Guaiac result negative.  Musculoskeletal:        General: No tenderness. Normal range of motion.     Cervical back: Neck supple.  Lymphadenopathy:     Cervical: No cervical adenopathy.  Skin:    General: Skin is warm and dry.     Coloration: Skin is not pale.     Findings: No erythema or rash.  Neurological:     Mental Status: He is alert and oriented to person, place, and time.     Cranial Nerves: No cranial nerve deficit.     Motor: No abnormal muscle tone.     Coordination: Coordination normal.     Deep Tendon Reflexes: Reflexes are normal and symmetric. Reflexes normal.  Psychiatric:        Behavior: Behavior normal.  Thought Content: Thought content normal.        Judgment: Judgment normal.           Assessment & Plan:  Well exam. We discussed diet and exercise. Get fasting labs. 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 Alysia Penna, MD

## 2020-06-27 ENCOUNTER — Encounter: Payer: Self-pay | Admitting: Family Medicine

## 2020-06-27 LAB — BASIC METABOLIC PANEL
BUN: 16 mg/dL (ref 7–25)
CO2: 27 mmol/L (ref 20–32)
Calcium: 9.1 mg/dL (ref 8.6–10.3)
Chloride: 104 mmol/L (ref 98–110)
Creat: 0.95 mg/dL (ref 0.70–1.33)
Glucose, Bld: 78 mg/dL (ref 65–99)
Potassium: 4 mmol/L (ref 3.5–5.3)
Sodium: 140 mmol/L (ref 135–146)

## 2020-06-27 LAB — HEPATIC FUNCTION PANEL
AG Ratio: 1.4 (calc) (ref 1.0–2.5)
ALT: 58 U/L — ABNORMAL HIGH (ref 9–46)
AST: 37 U/L — ABNORMAL HIGH (ref 10–35)
Albumin: 4.2 g/dL (ref 3.6–5.1)
Alkaline phosphatase (APISO): 111 U/L (ref 35–144)
Bilirubin, Direct: 0.2 mg/dL (ref 0.0–0.2)
Globulin: 2.9 g/dL (calc) (ref 1.9–3.7)
Indirect Bilirubin: 0.5 mg/dL (calc) (ref 0.2–1.2)
Total Bilirubin: 0.7 mg/dL (ref 0.2–1.2)
Total Protein: 7.1 g/dL (ref 6.1–8.1)

## 2020-06-27 LAB — IRON,TIBC AND FERRITIN PANEL
Ferritin: 26 ng/mL — ABNORMAL LOW (ref 38–380)
Iron: 381 ug/dL — ABNORMAL HIGH (ref 50–180)

## 2020-06-27 LAB — CBC WITH DIFFERENTIAL/PLATELET
Absolute Monocytes: 399 cells/uL (ref 200–950)
Basophils Absolute: 38 cells/uL (ref 0–200)
Basophils Relative: 1 %
Eosinophils Absolute: 171 cells/uL (ref 15–500)
Eosinophils Relative: 4.5 %
HCT: 45.3 % (ref 38.5–50.0)
Hemoglobin: 15.3 g/dL (ref 13.2–17.1)
Lymphs Abs: 688 cells/uL — ABNORMAL LOW (ref 850–3900)
MCH: 30.5 pg (ref 27.0–33.0)
MCHC: 33.8 g/dL (ref 32.0–36.0)
MCV: 90.2 fL (ref 80.0–100.0)
MPV: 11.2 fL (ref 7.5–12.5)
Monocytes Relative: 10.5 %
Neutro Abs: 2504 cells/uL (ref 1500–7800)
Neutrophils Relative %: 65.9 %
Platelets: 212 10*3/uL (ref 140–400)
RBC: 5.02 10*6/uL (ref 4.20–5.80)
RDW: 13.5 % (ref 11.0–15.0)
Total Lymphocyte: 18.1 %
WBC: 3.8 10*3/uL (ref 3.8–10.8)

## 2020-06-27 LAB — TSH: TSH: 2.15 mIU/L (ref 0.40–4.50)

## 2020-06-27 LAB — LIPID PANEL
Cholesterol: 152 mg/dL (ref <200)
HDL: 78 mg/dL (ref >=40)
LDL Cholesterol (Calc): 62 mg/dL (calc)
Non-HDL Cholesterol (Calc): 74 mg/dL (calc) (ref <130)
Total CHOL/HDL Ratio: 1.9 (calc) (ref <5.0)
Triglycerides: 50 mg/dL (ref <150)

## 2020-06-27 LAB — T4, FREE: Free T4: 1.2 ng/dL (ref 0.8–1.8)

## 2020-06-27 LAB — T3, FREE: T3, Free: 3 pg/mL (ref 2.3–4.2)

## 2020-06-27 LAB — PSA: PSA: 0.3 ng/mL (ref <=4.0)

## 2020-06-27 LAB — VITAMIN B12: Vitamin B-12: 527 pg/mL (ref 200–1100)

## 2020-06-27 NOTE — Telephone Encounter (Signed)
No need to worry about the serum iron level. No there was nothing to explain the tingling in his leg. I will refer him to Neurology for this

## 2020-06-27 NOTE — Addendum Note (Signed)
Addended by: Alysia Penna A on: 06/27/2020 02:34 PM   Modules accepted: Orders

## 2020-06-28 ENCOUNTER — Encounter: Payer: Self-pay | Admitting: Neurology

## 2020-06-29 ENCOUNTER — Encounter: Payer: Self-pay | Admitting: Neurology

## 2020-07-09 ENCOUNTER — Encounter: Payer: Self-pay | Admitting: Neurology

## 2020-07-09 ENCOUNTER — Ambulatory Visit: Payer: Federal, State, Local not specified - PPO | Admitting: Neurology

## 2020-07-09 ENCOUNTER — Other Ambulatory Visit: Payer: Self-pay

## 2020-07-09 VITALS — BP 118/77 | HR 54 | Ht 71.0 in | Wt 200.2 lb

## 2020-07-09 DIAGNOSIS — M5416 Radiculopathy, lumbar region: Secondary | ICD-10-CM | POA: Diagnosis not present

## 2020-07-09 DIAGNOSIS — M4306 Spondylolysis, lumbar region: Secondary | ICD-10-CM | POA: Diagnosis not present

## 2020-07-09 NOTE — Progress Notes (Signed)
[facility] Clinic Note - Initial Visit   Date: 07/09/20  Mario Moore MRN: [id number] DOB: Nov 24, 1971   Dear Dr. [name]:  Thank you for your kind referral of Mario Moore for consultation of right foot tingling. Although his history is well known to you, please allow [place] to reiterate it for the purpose of our medical record. The patient was accompanied to the clinic by self.  History of Present Illness: Mario Moore is a 49 y.o. right-handed male with hypothyroidism and hereditary hemorrhagic telangiectasias presenting for evaluation of right foot tingling.  Starting around June 2021, he noticed tingling over the drop of the right foot and sometimes into the lower leg.  Symptoms are constant, improved with exercise. Laying down makes it worse and he has noticed that the discolored and very cold. He has low back pain and sees a chiropractor monthly.  He denies weakness or similar symptoms back. He works in Engineer, production as a Librarian, academic, mostly sedentary.  Prior MRI/A brain from March 2021 at [facility].  He has not had imaging of the lumbar spine since, however the past he was followed at [facility] paper records, electronic medical record, and images have been reviewed where available and summarized as:  MRI/A head 02/14/2020:  Normal MRI/MRA of the brain.  Lab Results  Component Value Date   [id number] 062 06/26/2020   Lab Results  Component Value Date   TSH 2.15 06/26/2020   No results found for: ESRSEDRATE, POCTSEDRATE  Past Medical History:  Diagnosis Date  . Allergy    seasonal  . Anemia    due to frequency of nose bleeds- not a problem now-uses OTC iron supplement  . Complication of anesthesia    slow to wake up after surgery  . History of seasonal allergies    tx. Cetirizine  . Hx of adenomatous polyp of colon 07/06/2019  . Hypothyroidism   . Low back pain    gets acupuncture from Dr.  [name] at [facility]  . Personal history of hereditary hemorrhagic telangiectasia (HHT)    sees Dr. [name] at [facility]     Past Surgical History:  Procedure Laterality Date  . COLONOSCOPY  07/01/2019   per Dr. Carlean Purl, adenomatous polyp, repeat in 7 yrs   . ESOPHAGOGASTRODUODENOSCOPY    . KNEE ARTHROSCOPY WITH MEDIAL MENISECTOMY Left 01/09/2016   Procedure: LEFT KNEE ARTHROSCOPY WITH MEDIAL DEBRIDEMENT ;  Surgeon: Gaynelle Arabian, MD;  Location: [facility];  Service: Orthopedics;  Laterality: Left;  . LASIK Bilateral 1995  . nasoseptolplasty     has a deviated septum  . VASECTOMY       Medications:  Outpatient Encounter Medications as of 07/09/2020  Medication Sig  . cetirizine (ZYRTEC) 10 MG tablet Take 10 mg by mouth as needed.   . Estriol POWD Place 1 application into the nose 2 (two) times daily. Compounded at [facility] 0.1 %/per pt it is a compound drug in a gel  . ferrous sulfate (IRON SUPPLEMENT) 325 (65 FE) MG tablet Take 325 mg by mouth daily with breakfast.   . levothyroxine (SYNTHROID) 100 MCG tablet TAKE 1 TABLET(100 MCG) BY MOUTH DAILY  . temazepam (RESTORIL) 30 MG capsule TAKE 1 CAPSULE BY MOUTH AT BEDTIME AS NEEDED FOR SLEEP   No facility-administered encounter medications on file as of 07/09/2020.    Allergies:  Allergies  Allergen Reactions  . Aspirin     Thins out  blood, has HHT  . Ibuprofen Other (See Comments)    HHT    Family History: Family History  Problem Relation Age of Onset  . Prostate cancer Father   . Irritable bowel syndrome Mother   . Osler-Weber-Rendu syndrome Mother        HHT  . Osler-Weber-Rendu syndrome Sister        HHT  . Osler-Weber-Rendu syndrome Maternal Grandfather        HHT    Social History: Social History   Tobacco Use  . Smoking status: Never Smoker  . Smokeless tobacco: Never Used  Substance Use Topics  . Alcohol use: Yes    Alcohol/week: 6.0 standard drinks    Types: 6 Cans of  beer per week    Comment: beer on weekends -occ.  . 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  Speech is not dysarthric.  CRANIAL NERVES: II:  No visual field defects.   III-IV-VI: Pupils equal round and reactive to light.  Normal conjugate, extra-ocular eye movements in all directions of gaze.  No nystagmus.  No ptosis.   V:  Normal facial sensation.    VII:  Normal facial symmetry and movements.   VIII:  Normal hearing and vestibular function.   IX-X:  Normal palatal movement.   XI:  Normal shoulder shrug and head rotation.   XII:  Normal tongue strength and range of motion, no deviation or fasciculation.  MOTOR:  No atrophy, fasciculations or abnormal movements.  No pronator drift.   Upper Extremity:  Right  Left  Deltoid  5/5   5/5   Biceps  5/5   5/5   Triceps  5/5   5/5   Infraspinatus 5/5  5/5  Medial pectoralis 5/5  5/5  Wrist extensors  5/5   5/5   Wrist flexors  5/5   5/5   Finger extensors  5/5   5/5   Finger flexors  5/5   5/5   Dorsal interossei  5/5   5/5   Abductor pollicis  5/5   5/5   Tone (Ashworth scale)  0  0   Lower Extremity:  Right  Left  Hip flexors  5/5   5/5   Hip extensors  5/5   5/5   Adductor 5/5  5/5  Abductor 5/5  5/5  Knee flexors  5/5   5/5   Knee extensors  5/5   5/5   Dorsiflexors  5/5   5/5   Plantarflexors  5/5   5/5   Toe extensors  5/5   5/5   Toe flexors  5/5   5/5   Tone (Ashworth scale)  0  0   MSRs:  Right        Left                  brachioradialis 2+  2+  biceps 2+  2+  triceps 2+   2+  patellar 2+  2+  ankle jerk 2+  2+  Hoffman no  no  plantar response down  down   SENSORY:  Normal and symmetric perception of light touch, pinprick, vibration, and proprioception.  Romberg's sign absent.   COORDINATION/GAIT: Normal finger-to- nose-finger and heel-to-shin.  Intact rapid alternating movements bilaterally.  Able to rise from a chair without using arms.  Gait narrow based and stable. Tandem and stressed gait intact.    IMPRESSION: Probable L5 radiculopathy affecting the right lower extremity  -Start physical therapy for low back strengthening  -Muscle relaxants declined  -MRI lumbar spine, if symptoms progress or do not improve with conservative therapies  Return to clinic in 4 months.   Thank you for allowing me to participate in patient's care.  If I can answer any additional questions, I would be pleased to do so.    Sincerely,    Daquana Paddock K. Posey Pronto, DO

## 2020-07-09 NOTE — Patient Instructions (Addendum)
Start physical therapy for low back pain  Return to clinic in 4 months

## 2020-07-09 NOTE — Progress Notes (Signed)
PT referral for PT made to [facility] @ [place]

## 2020-07-26 ENCOUNTER — Ambulatory Visit: Payer: Federal, State, Local not specified - PPO | Admitting: Internal Medicine

## 2020-09-10 ENCOUNTER — Other Ambulatory Visit: Payer: Self-pay | Admitting: Family Medicine

## 2020-09-20 ENCOUNTER — Other Ambulatory Visit: Payer: Self-pay

## 2020-09-20 ENCOUNTER — Ambulatory Visit: Payer: Federal, State, Local not specified - PPO | Admitting: Family Medicine

## 2020-09-20 ENCOUNTER — Encounter: Payer: Self-pay | Admitting: Family Medicine

## 2020-09-20 VITALS — BP 122/80 | HR 54 | Temp 97.9°F | Ht 71.0 in | Wt 202.0 lb

## 2020-09-20 DIAGNOSIS — R310 Gross hematuria: Secondary | ICD-10-CM

## 2020-09-20 DIAGNOSIS — N390 Urinary tract infection, site not specified: Secondary | ICD-10-CM | POA: Diagnosis not present

## 2020-09-20 LAB — POCT URINALYSIS DIPSTICK
Bilirubin, UA: NEGATIVE
Glucose, UA: NEGATIVE
Ketones, UA: NEGATIVE
Nitrite, UA: NEGATIVE
Protein, UA: POSITIVE — AB
Spec Grav, UA: 1.025 (ref 1.010–1.025)
Urobilinogen, UA: 0.2 E.U./dL
pH, UA: 5 (ref 5.0–8.0)

## 2020-09-20 MED ORDER — CIPROFLOXACIN HCL 500 MG PO TABS: 500.0000 mg | ORAL_TABLET | Freq: Two times a day (BID) | ORAL | 0 refills | Status: DC

## 2020-09-20 NOTE — Addendum Note (Signed)
Addended by: Matilde Sprang on: 09/20/2020 10:34 AM   Modules accepted: Orders

## 2020-09-20 NOTE — Progress Notes (Signed)
   Subjective:    Patient ID: Mario Moore, male    DOB: [birth year], 49 y.o.   MRN: [id number]  HPI Here for 2 days of urinary urgency and burning, with traces of blood in the urine. No back pain or fever. He drinks lot of water every day. He was treated with Doxycycline for a similar issue in May. The culture at that time showed no growth.    Review of Systems  Constitutional: Negative.   Respiratory: Negative.   Cardiovascular: Negative.   Gastrointestinal: Negative.   Genitourinary: Positive for dysuria, hematuria and urgency.       Objective:   Physical Exam Constitutional:      Appearance: Normal appearance.  Cardiovascular:     Rate and Rhythm: Normal rate and regular rhythm.     Pulses: Normal pulses.     Heart sounds: Normal heart sounds.  Pulmonary:     Effort: Pulmonary effort is normal.     Breath sounds: Normal breath sounds.  Neurological:     Mental Status: He is alert.           Assessment & Plan:  UTI, treat with Cipro. Culture the sample.  Alysia Penna, MD

## 2020-09-21 LAB — URINE CULTURE
MICRO NUMBER:: 11072323
Result:: NO GROWTH
SPECIMEN QUALITY:: ADEQUATE

## 2020-09-21 NOTE — Telephone Encounter (Signed)
It's difficult to tell from the picture. This could be a stone or it could be a cast (either mucus or WBC cast). Either way, he should finish the antibiotic

## 2020-09-21 NOTE — Telephone Encounter (Signed)
The attached picture has a solid (looks like a stone) item.  Please advise if pt should continue the abx and what other symptoms that he needs to watch for.

## 2020-10-04 ENCOUNTER — Telehealth: Payer: Self-pay | Admitting: Neurology

## 2020-10-04 DIAGNOSIS — M4306 Spondylolysis, lumbar region: Secondary | ICD-10-CM

## 2020-10-04 DIAGNOSIS — M5416 Radiculopathy, lumbar region: Secondary | ICD-10-CM

## 2020-10-04 NOTE — Telephone Encounter (Signed)
Called patient and informed him that referral has been sent to the [facility] location.

## 2020-10-04 NOTE — Telephone Encounter (Signed)
Patient left a VM stating that he just called to sch his PT that Posey Pronto ordered for him in Aug or Sept. They told him that the PT order has expired and he would need a new one. He wants it done at the [place] location

## 2020-10-05 DIAGNOSIS — M5136 Other intervertebral disc degeneration, lumbar region: Secondary | ICD-10-CM | POA: Diagnosis not present

## 2020-10-05 DIAGNOSIS — M9902 Segmental and somatic dysfunction of thoracic region: Secondary | ICD-10-CM | POA: Diagnosis not present

## 2020-10-05 DIAGNOSIS — M9905 Segmental and somatic dysfunction of pelvic region: Secondary | ICD-10-CM | POA: Diagnosis not present

## 2020-10-05 DIAGNOSIS — M5441 Lumbago with sciatica, right side: Secondary | ICD-10-CM | POA: Diagnosis not present

## 2020-10-05 DIAGNOSIS — M9903 Segmental and somatic dysfunction of lumbar region: Secondary | ICD-10-CM | POA: Diagnosis not present

## 2020-10-05 DIAGNOSIS — M41126 Adolescent idiopathic scoliosis, lumbar region: Secondary | ICD-10-CM | POA: Diagnosis not present

## 2020-10-10 DIAGNOSIS — M9902 Segmental and somatic dysfunction of thoracic region: Secondary | ICD-10-CM | POA: Diagnosis not present

## 2020-10-10 DIAGNOSIS — M9905 Segmental and somatic dysfunction of pelvic region: Secondary | ICD-10-CM | POA: Diagnosis not present

## 2020-10-10 DIAGNOSIS — M5441 Lumbago with sciatica, right side: Secondary | ICD-10-CM | POA: Diagnosis not present

## 2020-10-10 DIAGNOSIS — M5136 Other intervertebral disc degeneration, lumbar region: Secondary | ICD-10-CM | POA: Diagnosis not present

## 2020-10-10 DIAGNOSIS — M9903 Segmental and somatic dysfunction of lumbar region: Secondary | ICD-10-CM | POA: Diagnosis not present

## 2020-10-11 DIAGNOSIS — M5441 Lumbago with sciatica, right side: Secondary | ICD-10-CM | POA: Diagnosis not present

## 2020-10-11 DIAGNOSIS — M5136 Other intervertebral disc degeneration, lumbar region: Secondary | ICD-10-CM | POA: Diagnosis not present

## 2020-10-11 DIAGNOSIS — M9903 Segmental and somatic dysfunction of lumbar region: Secondary | ICD-10-CM | POA: Diagnosis not present

## 2020-10-11 DIAGNOSIS — M9902 Segmental and somatic dysfunction of thoracic region: Secondary | ICD-10-CM | POA: Diagnosis not present

## 2020-10-11 DIAGNOSIS — M9905 Segmental and somatic dysfunction of pelvic region: Secondary | ICD-10-CM | POA: Diagnosis not present

## 2020-10-11 DIAGNOSIS — M48061 Spinal stenosis, lumbar region without neurogenic claudication: Secondary | ICD-10-CM | POA: Diagnosis not present

## 2020-10-11 DIAGNOSIS — M5116 Intervertebral disc disorders with radiculopathy, lumbar region: Secondary | ICD-10-CM | POA: Diagnosis not present

## 2020-10-12 DIAGNOSIS — M9905 Segmental and somatic dysfunction of pelvic region: Secondary | ICD-10-CM | POA: Diagnosis not present

## 2020-10-12 DIAGNOSIS — M9902 Segmental and somatic dysfunction of thoracic region: Secondary | ICD-10-CM | POA: Diagnosis not present

## 2020-10-12 DIAGNOSIS — M9903 Segmental and somatic dysfunction of lumbar region: Secondary | ICD-10-CM | POA: Diagnosis not present

## 2020-10-12 DIAGNOSIS — M62830 Muscle spasm of back: Secondary | ICD-10-CM | POA: Diagnosis not present

## 2020-10-18 DIAGNOSIS — M5126 Other intervertebral disc displacement, lumbar region: Secondary | ICD-10-CM | POA: Diagnosis not present

## 2020-10-18 DIAGNOSIS — M5416 Radiculopathy, lumbar region: Secondary | ICD-10-CM | POA: Diagnosis not present

## 2020-10-18 DIAGNOSIS — M5136 Other intervertebral disc degeneration, lumbar region: Secondary | ICD-10-CM | POA: Diagnosis not present

## 2020-10-22 DIAGNOSIS — M5116 Intervertebral disc disorders with radiculopathy, lumbar region: Secondary | ICD-10-CM | POA: Diagnosis not present

## 2020-10-22 DIAGNOSIS — M5126 Other intervertebral disc displacement, lumbar region: Secondary | ICD-10-CM | POA: Diagnosis not present

## 2020-10-23 DIAGNOSIS — J069 Acute upper respiratory infection, unspecified: Secondary | ICD-10-CM | POA: Diagnosis not present

## 2020-10-23 DIAGNOSIS — J101 Influenza due to other identified influenza virus with other respiratory manifestations: Secondary | ICD-10-CM | POA: Diagnosis not present

## 2020-10-27 HISTORY — PX: LUMBAR LAMINECTOMY: SHX95

## 2020-11-08 ENCOUNTER — Ambulatory Visit: Payer: Federal, State, Local not specified - PPO | Admitting: Neurology

## 2020-11-27 ENCOUNTER — Ambulatory Visit: Payer: Federal, State, Local not specified - PPO

## 2020-12-04 ENCOUNTER — Ambulatory Visit: Payer: Federal, State, Local not specified - PPO

## 2020-12-04 DIAGNOSIS — M545 Low back pain, unspecified: Secondary | ICD-10-CM | POA: Diagnosis not present

## 2020-12-04 DIAGNOSIS — M5416 Radiculopathy, lumbar region: Secondary | ICD-10-CM | POA: Diagnosis not present

## 2020-12-07 ENCOUNTER — Other Ambulatory Visit: Payer: Self-pay | Admitting: Family Medicine

## 2020-12-11 ENCOUNTER — Encounter: Payer: Self-pay | Admitting: Family Medicine

## 2020-12-12 ENCOUNTER — Other Ambulatory Visit: Payer: Self-pay

## 2020-12-12 DIAGNOSIS — R030 Elevated blood-pressure reading, without diagnosis of hypertension: Secondary | ICD-10-CM | POA: Diagnosis not present

## 2020-12-12 DIAGNOSIS — M5416 Radiculopathy, lumbar region: Secondary | ICD-10-CM | POA: Diagnosis not present

## 2020-12-12 DIAGNOSIS — Z6827 Body mass index (BMI) 27.0-27.9, adult: Secondary | ICD-10-CM | POA: Diagnosis not present

## 2020-12-12 MED ORDER — LEVOTHYROXINE SODIUM 100 MCG PO TABS: ORAL_TABLET | ORAL | 0 refills | Status: DC

## 2020-12-12 NOTE — Telephone Encounter (Signed)
  LAST APPOINTMENT DATE: 09/20/20  NEXT APPOINTMENT DATE:@Visit  date not found  MEDICATION:Temazepam 30mg   PHARMACY:[facility]  **Let patient know to contact pharmacy at the end of the day to make sure medication is ready. **  ** Please notify patient to allow 48-72 hours to process**  **Encourage patient to contact the pharmacy for refills or they can request refills through [facility]**  CLINICAL FILLS OUT ALL BELOW:   LAST REFILL:  05/16/20  QTY:30 with 5 refills  REFILL DATE:05/16/20    OTHER COMMENTS:    Okay for refill?  Please advise

## 2020-12-14 MED ORDER — TEMAZEPAM 30 MG PO CAPS: ORAL_CAPSULE | ORAL | 5 refills | Status: DC

## 2020-12-14 NOTE — Telephone Encounter (Signed)
I refilled that this morning

## 2021-01-21 DIAGNOSIS — M5416 Radiculopathy, lumbar region: Secondary | ICD-10-CM | POA: Diagnosis not present

## 2021-01-23 DIAGNOSIS — M5416 Radiculopathy, lumbar region: Secondary | ICD-10-CM | POA: Diagnosis not present

## 2021-01-28 DIAGNOSIS — M5416 Radiculopathy, lumbar region: Secondary | ICD-10-CM | POA: Diagnosis not present

## 2021-02-01 DIAGNOSIS — M5416 Radiculopathy, lumbar region: Secondary | ICD-10-CM | POA: Diagnosis not present

## 2021-02-04 DIAGNOSIS — M5416 Radiculopathy, lumbar region: Secondary | ICD-10-CM | POA: Diagnosis not present

## 2021-02-06 DIAGNOSIS — M5416 Radiculopathy, lumbar region: Secondary | ICD-10-CM | POA: Diagnosis not present

## 2021-02-12 DIAGNOSIS — M5416 Radiculopathy, lumbar region: Secondary | ICD-10-CM | POA: Diagnosis not present

## 2021-03-12 ENCOUNTER — Other Ambulatory Visit: Payer: Self-pay | Admitting: Family Medicine

## 2021-04-09 DIAGNOSIS — D2261 Melanocytic nevi of right upper limb, including shoulder: Secondary | ICD-10-CM | POA: Diagnosis not present

## 2021-04-09 DIAGNOSIS — D225 Melanocytic nevi of trunk: Secondary | ICD-10-CM | POA: Diagnosis not present

## 2021-04-09 DIAGNOSIS — D1801 Hemangioma of skin and subcutaneous tissue: Secondary | ICD-10-CM | POA: Diagnosis not present

## 2021-04-09 DIAGNOSIS — L821 Other seborrheic keratosis: Secondary | ICD-10-CM | POA: Diagnosis not present

## 2021-06-08 ENCOUNTER — Other Ambulatory Visit: Payer: Self-pay | Admitting: Family Medicine

## 2021-06-12 ENCOUNTER — Other Ambulatory Visit: Payer: Self-pay | Admitting: Family Medicine

## 2021-06-13 ENCOUNTER — Other Ambulatory Visit: Payer: Self-pay | Admitting: Family Medicine

## 2021-06-14 NOTE — Telephone Encounter (Signed)
Last refill- 12/14/20-30 cap, 5 refills Last office visit- 09/20/20  No future appointment scheduled

## 2021-06-21 DIAGNOSIS — Z6826 Body mass index (BMI) 26.0-26.9, adult: Secondary | ICD-10-CM | POA: Diagnosis not present

## 2021-06-21 DIAGNOSIS — M5416 Radiculopathy, lumbar region: Secondary | ICD-10-CM | POA: Diagnosis not present

## 2021-07-29 ENCOUNTER — Encounter: Payer: Self-pay | Admitting: Family Medicine

## 2021-07-29 ENCOUNTER — Other Ambulatory Visit: Payer: Self-pay

## 2021-07-29 ENCOUNTER — Ambulatory Visit (INDEPENDENT_AMBULATORY_CARE_PROVIDER_SITE_OTHER): Payer: Federal, State, Local not specified - PPO | Admitting: Family Medicine

## 2021-07-29 VITALS — BP 118/76 | HR 52 | Temp 98.1°F | Ht 71.0 in | Wt 195.0 lb

## 2021-07-29 DIAGNOSIS — Z0000 Encounter for general adult medical examination without abnormal findings: Secondary | ICD-10-CM

## 2021-07-29 DIAGNOSIS — Z23 Encounter for immunization: Secondary | ICD-10-CM | POA: Diagnosis not present

## 2021-07-29 LAB — CBC WITH DIFFERENTIAL/PLATELET
Basophils Absolute: 0 10*3/uL (ref 0.0–0.1)
Basophils Relative: 1 % (ref 0.0–3.0)
Eosinophils Absolute: 0.1 10*3/uL (ref 0.0–0.7)
Eosinophils Relative: 2.9 % (ref 0.0–5.0)
HCT: 43.7 % (ref 39.0–52.0)
Hemoglobin: 14.6 g/dL (ref 13.0–17.0)
Lymphocytes Relative: 25.1 % (ref 12.0–46.0)
Lymphs Abs: 0.7 10*3/uL (ref 0.7–4.0)
MCHC: 33.4 g/dL (ref 30.0–36.0)
MCV: 93.7 fl (ref 78.0–100.0)
Monocytes Absolute: 0.3 10*3/uL (ref 0.1–1.0)
Monocytes Relative: 10.2 % (ref 3.0–12.0)
Neutro Abs: 1.7 10*3/uL (ref 1.4–7.7)
Neutrophils Relative %: 60.8 % (ref 43.0–77.0)
Platelets: 196 10*3/uL (ref 150.0–400.0)
RBC: 4.66 Mil/uL (ref 4.22–5.81)
RDW: 14.4 % (ref 11.5–15.5)
WBC: 2.8 10*3/uL — ABNORMAL LOW (ref 4.0–10.5)

## 2021-07-29 LAB — LIPID PANEL
Cholesterol: 163 mg/dL (ref 0–200)
HDL: 85.4 mg/dL (ref >39.00)
LDL Cholesterol: 68 mg/dL (ref 0–99)
NonHDL: 77.72
Total CHOL/HDL Ratio: 2
Triglycerides: 51 mg/dL (ref 0.0–149.0)
VLDL: 10.2 mg/dL (ref 0.0–40.0)

## 2021-07-29 LAB — PSA: PSA: 0.36 ng/mL (ref 0.10–4.00)

## 2021-07-29 LAB — HEMOGLOBIN A1C: Hgb A1c MFr Bld: 4.8 % (ref 4.6–6.5)

## 2021-07-29 LAB — BASIC METABOLIC PANEL
BUN: 12 mg/dL (ref 6–23)
CO2: 28 mEq/L (ref 19–32)
Calcium: 9.6 mg/dL (ref 8.4–10.5)
Chloride: 102 mEq/L (ref 96–112)
Creatinine, Ser: 0.91 mg/dL (ref 0.40–1.50)
GFR: 97.13 mL/min (ref >60.00)
Glucose, Bld: 80 mg/dL (ref 70–99)
Potassium: 4.1 mEq/L (ref 3.5–5.1)
Sodium: 139 mEq/L (ref 135–145)

## 2021-07-29 LAB — T4, FREE: Free T4: 0.97 ng/dL (ref 0.60–1.60)

## 2021-07-29 LAB — HEPATIC FUNCTION PANEL
ALT: 91 U/L — ABNORMAL HIGH (ref 0–53)
AST: 53 U/L — ABNORMAL HIGH (ref 0–37)
Albumin: 4.3 g/dL (ref 3.5–5.2)
Alkaline Phosphatase: 91 U/L (ref 39–117)
Bilirubin, Direct: 0.2 mg/dL (ref 0.0–0.3)
Total Bilirubin: 0.9 mg/dL (ref 0.2–1.2)
Total Protein: 7 g/dL (ref 6.0–8.3)

## 2021-07-29 LAB — TSH: TSH: 1.54 u[IU]/mL (ref 0.35–5.50)

## 2021-07-29 LAB — T3, FREE: T3, Free: 4.9 pg/mL — ABNORMAL HIGH (ref 2.3–4.2)

## 2021-07-29 NOTE — Addendum Note (Signed)
Addended by: Wyvonne Lenz on: 07/29/2021 02:30 PM   Modules accepted: Orders

## 2021-07-29 NOTE — Progress Notes (Signed)
   Subjective:    Patient ID: Mario Moore, male    DOB: [birth year], 50 y.o.   MRN: [id number]  HPI Here for a well exam. He feels fine.    Review of Systems  Constitutional: Negative.   HENT: Negative.    Eyes: Negative.   Respiratory: Negative.    Cardiovascular: Negative.   Gastrointestinal: Negative.   Genitourinary: Negative.   Musculoskeletal: Negative.   Skin: Negative.   Neurological: Negative.   Psychiatric/Behavioral: Negative.        Objective:   Physical Exam Constitutional:      General: He is not in acute distress.    Appearance: Normal appearance. He is well-developed. He is not diaphoretic.  HENT:     Head: Normocephalic and atraumatic.     Right Ear: External ear normal.     Left Ear: External ear normal.     Nose: Nose normal.     Mouth/Throat:     Pharynx: No oropharyngeal exudate.  Eyes:     General: No scleral icterus.       Right eye: No discharge.        Left eye: No discharge.     Conjunctiva/sclera: Conjunctivae normal.     Pupils: Pupils are equal, round, and reactive to light.  Neck:     Thyroid: No thyromegaly.     Vascular: No JVD.     Trachea: No tracheal deviation.  Cardiovascular:     Rate and Rhythm: Normal rate and regular rhythm.     Heart sounds: Normal heart sounds. No murmur heard.   No friction rub. No gallop.  Pulmonary:     Effort: Pulmonary effort is normal. No respiratory distress.     Breath sounds: Normal breath sounds. No wheezing or rales.  Chest:     Chest wall: No tenderness.  Abdominal:     General: Bowel sounds are normal. There is no distension.     Palpations: Abdomen is soft. There is no mass.     Tenderness: There is no abdominal tenderness. There is no guarding or rebound.  Genitourinary:    Penis: Normal. No tenderness.      Testes: Normal.     Prostate: Normal.     Rectum: Normal. Guaiac result negative.  Musculoskeletal:        General: No tenderness. Normal range of motion.     Cervical  back: Neck supple.  Lymphadenopathy:     Cervical: No cervical adenopathy.  Skin:    General: Skin is warm and dry.     Coloration: Skin is not pale.     Findings: No erythema or rash.  Neurological:     Mental Status: He is alert and oriented to person, place, and time.     Cranial Nerves: No cranial nerve deficit.     Motor: No abnormal muscle tone.     Coordination: Coordination normal.     Deep Tendon Reflexes: Reflexes are normal and symmetric. Reflexes normal.  Psychiatric:        Behavior: Behavior normal.        Thought Content: Thought content normal.        Judgment: Judgment normal.          Assessment & Plan:  Well exam. We discussed diet and exercise. Get fasting labs. Alysia Penna, MD

## 2021-07-29 NOTE — Addendum Note (Signed)
Addended by: Amanda Cockayne on: 07/29/2021 02:34 PM   Modules accepted: Orders

## 2021-09-18 ENCOUNTER — Other Ambulatory Visit: Payer: Self-pay | Admitting: Family Medicine

## 2021-09-19 MED ORDER — LEVOTHYROXINE SODIUM 100 MCG PO TABS: ORAL_TABLET | ORAL | 1 refills | Status: DC

## 2021-12-31 ENCOUNTER — Encounter: Payer: Self-pay | Admitting: Family Medicine

## 2022-01-01 MED ORDER — TEMAZEPAM 30 MG PO CAPS: 30.0000 mg | ORAL_CAPSULE | Freq: Every day | ORAL | 5 refills | Status: DC

## 2022-01-01 NOTE — Telephone Encounter (Signed)
I sent in the refills  

## 2022-03-19 ENCOUNTER — Other Ambulatory Visit: Payer: Self-pay | Admitting: Family Medicine

## 2022-04-10 DIAGNOSIS — L821 Other seborrheic keratosis: Secondary | ICD-10-CM | POA: Diagnosis not present

## 2022-04-10 DIAGNOSIS — D2261 Melanocytic nevi of right upper limb, including shoulder: Secondary | ICD-10-CM | POA: Diagnosis not present

## 2022-04-10 DIAGNOSIS — D225 Melanocytic nevi of trunk: Secondary | ICD-10-CM | POA: Diagnosis not present

## 2022-04-10 DIAGNOSIS — D2262 Melanocytic nevi of left upper limb, including shoulder: Secondary | ICD-10-CM | POA: Diagnosis not present

## 2022-06-20 ENCOUNTER — Other Ambulatory Visit: Payer: Self-pay | Admitting: Family Medicine

## 2022-07-21 ENCOUNTER — Encounter: Payer: Self-pay | Admitting: Family Medicine

## 2022-07-21 MED ORDER — TEMAZEPAM 30 MG PO CAPS: 30.0000 mg | ORAL_CAPSULE | Freq: Every day | ORAL | 5 refills | Status: DC

## 2022-07-21 NOTE — Telephone Encounter (Signed)
Done

## 2022-07-30 ENCOUNTER — Ambulatory Visit (INDEPENDENT_AMBULATORY_CARE_PROVIDER_SITE_OTHER): Payer: Federal, State, Local not specified - PPO | Admitting: Family Medicine

## 2022-07-30 ENCOUNTER — Encounter: Payer: Self-pay | Admitting: Family Medicine

## 2022-07-30 VITALS — BP 96/68 | HR 56 | Temp 98.5°F | Ht 71.5 in | Wt 195.0 lb

## 2022-07-30 DIAGNOSIS — Z0000 Encounter for general adult medical examination without abnormal findings: Secondary | ICD-10-CM

## 2022-07-30 DIAGNOSIS — E039 Hypothyroidism, unspecified: Secondary | ICD-10-CM | POA: Diagnosis not present

## 2022-07-30 LAB — CBC WITH DIFFERENTIAL/PLATELET
Basophils Absolute: 0 10*3/uL (ref 0.0–0.1)
Basophils Relative: 1.9 % (ref 0.0–3.0)
Eosinophils Absolute: 0.2 10*3/uL (ref 0.0–0.7)
Eosinophils Relative: 5.9 % — ABNORMAL HIGH (ref 0.0–5.0)
HCT: 36 % — ABNORMAL LOW (ref 39.0–52.0)
Hemoglobin: 11.8 g/dL — ABNORMAL LOW (ref 13.0–17.0)
Lymphocytes Relative: 21.7 % (ref 12.0–46.0)
Lymphs Abs: 0.6 10*3/uL — ABNORMAL LOW (ref 0.7–4.0)
MCHC: 32.8 g/dL (ref 30.0–36.0)
MCV: 84 fl (ref 78.0–100.0)
Monocytes Absolute: 0.5 10*3/uL (ref 0.1–1.0)
Monocytes Relative: 18.6 % — ABNORMAL HIGH (ref 3.0–12.0)
Neutro Abs: 1.4 10*3/uL (ref 1.4–7.7)
Neutrophils Relative %: 51.9 % (ref 43.0–77.0)
Platelets: 237 10*3/uL (ref 150.0–400.0)
RBC: 4.28 Mil/uL (ref 4.22–5.81)
RDW: 15.5 % (ref 11.5–15.5)
WBC: 2.6 10*3/uL — ABNORMAL LOW (ref 4.0–10.5)

## 2022-07-30 LAB — BASIC METABOLIC PANEL
BUN: 18 mg/dL (ref 6–23)
CO2: 27 mEq/L (ref 19–32)
Calcium: 9.1 mg/dL (ref 8.4–10.5)
Chloride: 102 mEq/L (ref 96–112)
Creatinine, Ser: 0.91 mg/dL (ref 0.40–1.50)
GFR: 96.45 mL/min (ref >60.00)
Glucose, Bld: 91 mg/dL (ref 70–99)
Potassium: 4.2 mEq/L (ref 3.5–5.1)
Sodium: 138 mEq/L (ref 135–145)

## 2022-07-30 LAB — PSA: PSA: 0.29 ng/mL (ref 0.10–4.00)

## 2022-07-30 LAB — LIPID PANEL
Cholesterol: 144 mg/dL (ref 0–200)
HDL: 61.5 mg/dL (ref >39.00)
LDL Cholesterol: 70 mg/dL (ref 0–99)
NonHDL: 82.37
Total CHOL/HDL Ratio: 2
Triglycerides: 64 mg/dL (ref 0.0–149.0)
VLDL: 12.8 mg/dL (ref 0.0–40.0)

## 2022-07-30 LAB — HEPATIC FUNCTION PANEL
ALT: 32 U/L (ref 0–53)
AST: 28 U/L (ref 0–37)
Albumin: 4.2 g/dL (ref 3.5–5.2)
Alkaline Phosphatase: 89 U/L (ref 39–117)
Bilirubin, Direct: 0.1 mg/dL (ref 0.0–0.3)
Total Bilirubin: 0.5 mg/dL (ref 0.2–1.2)
Total Protein: 6.8 g/dL (ref 6.0–8.3)

## 2022-07-30 LAB — T3, FREE: T3, Free: 5.9 pg/mL — ABNORMAL HIGH (ref 2.3–4.2)

## 2022-07-30 LAB — TSH: TSH: 1.91 u[IU]/mL (ref 0.35–5.50)

## 2022-07-30 LAB — HEMOGLOBIN A1C: Hgb A1c MFr Bld: 5.2 % (ref 4.6–6.5)

## 2022-07-30 LAB — T4, FREE: Free T4: 0.92 ng/dL (ref 0.60–1.60)

## 2022-07-30 NOTE — Progress Notes (Signed)
   Subjective:    Patient ID: Mario Moore, male    DOB: [birth year], 51 y.o.   MRN: [id number]  HPI Here for a well exam. He feels fine.    Review of Systems  Constitutional: Negative.   HENT: Negative.    Eyes: Negative.   Respiratory: Negative.    Cardiovascular: Negative.   Gastrointestinal: Negative.   Genitourinary: Negative.   Musculoskeletal: Negative.   Skin: Negative.   Neurological: Negative.   Psychiatric/Behavioral: Negative.         Objective:   Physical Exam Constitutional:      General: He is not in acute distress.    Appearance: Normal appearance. He is well-developed. He is not diaphoretic.  HENT:     Head: Normocephalic and atraumatic.     Right Ear: External ear normal.     Left Ear: External ear normal.     Nose: Nose normal.     Mouth/Throat:     Pharynx: No oropharyngeal exudate.  Eyes:     General: No scleral icterus.       Right eye: No discharge.        Left eye: No discharge.     Conjunctiva/sclera: Conjunctivae normal.     Pupils: Pupils are equal, round, and reactive to light.  Neck:     Thyroid: No thyromegaly.     Vascular: No JVD.     Trachea: No tracheal deviation.  Cardiovascular:     Rate and Rhythm: Normal rate and regular rhythm.     Heart sounds: Normal heart sounds. No murmur heard.    No friction rub. No gallop.  Pulmonary:     Effort: Pulmonary effort is normal. No respiratory distress.     Breath sounds: Normal breath sounds. No wheezing or rales.  Chest:     Chest wall: No tenderness.  Abdominal:     General: Bowel sounds are normal. There is no distension.     Palpations: Abdomen is soft. There is no mass.     Tenderness: There is no abdominal tenderness. There is no guarding or rebound.  Genitourinary:    Penis: Normal. No tenderness.      Testes: Normal.     Prostate: Normal.     Rectum: Normal. Guaiac result negative.  Musculoskeletal:        General: No tenderness. Normal range of motion.      Cervical back: Neck supple.  Lymphadenopathy:     Cervical: No cervical adenopathy.  Skin:    General: Skin is warm and dry.     Coloration: Skin is not pale.     Findings: No erythema or rash.  Neurological:     Mental Status: He is alert and oriented to person, place, and time.     Cranial Nerves: No cranial nerve deficit.     Motor: No abnormal muscle tone.     Coordination: Coordination normal.     Deep Tendon Reflexes: Reflexes are normal and symmetric. Reflexes normal.  Psychiatric:        Behavior: Behavior normal.        Thought Content: Thought content normal.        Judgment: Judgment normal.           Assessment & Plan:  Well exam. We discussed diet and exercise. Get fasting labs. Alysia Penna, MD

## 2022-08-05 ENCOUNTER — Telehealth: Payer: Federal, State, Local not specified - PPO | Admitting: Physician Assistant

## 2022-08-05 ENCOUNTER — Telehealth: Payer: Federal, State, Local not specified - PPO | Admitting: Adult Health

## 2022-08-05 DIAGNOSIS — B880 Other acariasis: Secondary | ICD-10-CM

## 2022-08-05 MED ORDER — DOXYCYCLINE MONOHYDRATE 100 MG PO CAPS: 100.0000 mg | ORAL_CAPSULE | Freq: Two times a day (BID) | ORAL | 0 refills | Status: DC

## 2022-08-05 NOTE — Progress Notes (Signed)
Virtual Visit Consent   Phu Record, you are scheduled for a virtual visit with a [place] provider today. Just as with appointments in the office, your consent must be obtained to participate. Your consent will be active for this visit and any virtual visit you may have with one of our providers in the next 365 days. If you have a MyChart account, a copy of this consent can be sent to you electronically.  As this is a virtual visit, video technology does not allow for your provider to perform a traditional examination. This may limit your provider's ability to fully assess your condition. If your provider identifies any concerns that need to be evaluated in person or the need to arrange testing (such as labs, EKG, etc.), we will make arrangements to do so. Although advances in technology are sophisticated, we cannot ensure that it will always work on either your end or our end. If the connection with a video visit is poor, the visit may have to be switched to a telephone visit. With either a video or telephone visit, we are not always able to ensure that we have a secure connection.  By engaging in this virtual visit, you consent to the provision of healthcare and authorize for your insurance to be billed (if applicable) for the services provided during this visit. Depending on your insurance coverage, you may receive a charge related to this service.  I need to obtain your verbal consent now. Are you willing to proceed with your visit today? Mario Moore has provided verbal consent on 08/05/2022 for a virtual visit (video or telephone). Leeanne Rio, [place]  Date: 08/05/2022 10:31 AM  Virtual Visit via Video Note   I, Leeanne Rio, connected with  Mario Moore  ([id number], May 15, 1971) on 08/05/22 at 10:15 AM EDT by a video-enabled telemedicine application and verified that I am speaking with the correct person using two identifiers.  Location: Patient: Virtual  Visit Location Patient: Home Provider: Virtual Visit Location Provider: Home Office   I discussed the limitations of evaluation and management by telemedicine and the availability of in person appointments. The patient expressed understanding and agreed to proceed.    History of Present Illness: Mario Moore is a 51 y.o. who identifies as a male who was assigned male at birth, and is being seen today for concern of infection from bites. 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 Waist, backside, extremities. Noted severe itching for the next few days which has improved. Now since Friday notes some of the areas are draining and since Sunday with low-grade fever, chills, headache and body aches. Denies other rash. Denies nausea or vomiting. Denies AMS. Denies known tick bite.   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 , Disp: ,  Rfl:    ferrous sulfate 325 (65 FE) MG tablet, Take 325 mg by mouth daily with breakfast. , Disp: , Rfl:    levothyroxine (SYNTHROID) 100 MCG tablet, TAKE 1 TABLET BY MOUTH DAILY, Disp: 90 tablet, Rfl: 1   temazepam (RESTORIL) 30 MG capsule, Take 1 capsule (30 mg total) by mouth at bedtime., Disp: 30 capsule, Rfl: 5  Observations/Objective: Patient is well-developed, well-nourished in no acute distress.  Resting comfortably at home.   Head is normocephalic, atraumatic.  No labored breathing. Speech is clear and coherent with logical content.  Patient is alert and oriented at baseline.  Numerous small "bites" scattered over lower extremities and lower torso, more concentrated around the distal lower extremities  -- consistent with chigger/mite bites. A few of these lesions do have black scabbing in the center and a couple with some slight drainage. No evidence of surrounding cellulitis on visual examination.  Assessment and Plan: 1. Bites, chigger - doxycycline (MONODOX) 100 MG capsule; Take 1 capsule (100 mg total) by mouth 2 (two) times daily.  Dispense: 20 capsule; Refill: 0  Concern for mite borne illness. He is worried about Scrub Typhus but no travel to [place] or [place]. Cases here are very rare. Could be other mite-borne illness. Also some concern for start of cellulitis at site of a couple of lesions. Will start Doxycycline 100 mg BID x 10 days. Supportive measures and OTC medications reviewed. Strict ER precautions reviewed. He is to call and schedule follow-up in person with PCP for labs.   Follow Up Instructions: I discussed the assessment and treatment plan with the patient. The patient was provided an opportunity to ask questions and all were answered. The patient agreed with the plan and demonstrated an understanding of the instructions.  A copy of instructions were sent to the patient via MyChart unless otherwise noted below.   The patient was advised to call back or seek an in-person evaluation if the symptoms worsen or if the condition fails to improve as anticipated.  Time:  I spent 10 minutes with the patient via telehealth technology discussing the above problems/concerns.    Leeanne Rio, PA-C

## 2022-08-05 NOTE — Patient Instructions (Signed)
  Mario Moore, thank you for joining Leeanne Rio, PA-C for today's virtual visit.  While this provider is not your primary care provider (PCP), if your PCP is located in our provider database this encounter information will be shared with them immediately following your visit.  Consent: (Patient) Mario Moore provided verbal consent for this virtual visit at the beginning of the encounter.  Current Medications:  Current Outpatient Medications:    cetirizine (ZYRTEC) 10 MG tablet, Take 10 mg by mouth as needed. , Disp: , Rfl:    ferrous sulfate 325 (65 FE) MG tablet, Take 325 mg by mouth daily with breakfast. , Disp: , Rfl:    levothyroxine (SYNTHROID) 100 MCG tablet, TAKE 1 TABLET BY MOUTH DAILY, Disp: 90 tablet, Rfl: 1   temazepam (RESTORIL) 30 MG capsule, Take 1 capsule (30 mg total) by mouth at bedtime., Disp: 30 capsule, Rfl: 5   Medications ordered in this encounter:  No orders of the defined types were placed in this encounter.    *If you need refills on other medications prior to your next appointment, please contact your pharmacy*  Follow-Up: Call back or seek an in-person evaluation if the symptoms worsen or if the condition fails to improve as anticipated.  Other Instructions Keep skin clean and dry. Take the antibiotic as directed. Tylenol or ibuprofen for low-grade fever if needed. If symptoms are not improving and continuing to improve or any new/worsening symptoms, please seek an in-person evaluation ASAP.  I do want you to schedule in-office follow-up with your PCP for formal lab testing.    If you have been instructed to have an in-person evaluation today at a local Urgent Care facility, please use the link below. It will take you to a list of all of our available [facility], including address, phone number and hours of operation. Please do not delay care.  [facility]  If you or a family member do not have a primary  care provider, use the link below to schedule a visit and establish care. When you choose a [facility] primary care physician or advanced practice provider, you gain a long-term partner in health. Find a Primary Care Provider  Learn more about [facility] in-office and virtual care options: [facility]

## 2022-09-02 ENCOUNTER — Encounter: Payer: Self-pay | Admitting: Family Medicine

## 2022-09-03 NOTE — Telephone Encounter (Signed)
As long as Mario Moore is feeling better and his skin is clear, there is no need for follow up

## 2022-09-09 DIAGNOSIS — M961 Postlaminectomy syndrome, not elsewhere classified: Secondary | ICD-10-CM | POA: Diagnosis not present

## 2022-09-09 DIAGNOSIS — M5416 Radiculopathy, lumbar region: Secondary | ICD-10-CM | POA: Diagnosis not present

## 2022-09-09 DIAGNOSIS — M5459 Other low back pain: Secondary | ICD-10-CM | POA: Diagnosis not present

## 2022-09-16 DIAGNOSIS — M5459 Other low back pain: Secondary | ICD-10-CM | POA: Diagnosis not present

## 2022-09-23 ENCOUNTER — Other Ambulatory Visit: Payer: Self-pay

## 2022-09-23 ENCOUNTER — Other Ambulatory Visit: Payer: Self-pay | Admitting: Family Medicine

## 2022-09-30 DIAGNOSIS — M5416 Radiculopathy, lumbar region: Secondary | ICD-10-CM | POA: Diagnosis not present

## 2022-12-15 DIAGNOSIS — J029 Acute pharyngitis, unspecified: Secondary | ICD-10-CM | POA: Diagnosis not present

## 2022-12-15 DIAGNOSIS — M791 Myalgia, unspecified site: Secondary | ICD-10-CM | POA: Diagnosis not present

## 2022-12-15 DIAGNOSIS — U071 COVID-19: Secondary | ICD-10-CM | POA: Diagnosis not present

## 2022-12-15 DIAGNOSIS — R5383 Other fatigue: Secondary | ICD-10-CM | POA: Diagnosis not present

## 2022-12-22 ENCOUNTER — Other Ambulatory Visit: Payer: Self-pay | Admitting: Family Medicine

## 2023-01-22 ENCOUNTER — Telehealth: Payer: Self-pay | Admitting: Family Medicine

## 2023-01-23 NOTE — Telephone Encounter (Signed)
Last OV-07/30/22 Last refill-07/21/22--30 capsules, 5 refills  No future OV scheduled.

## 2023-01-27 NOTE — Telephone Encounter (Signed)
Pt called to FU on this refill, stating he thinks Pharmacy is still waiting for the PA.  Please advise.  LOV:  07/30/22   [facility] [id number] [address], [address] AT [address] Phone: [phone]  Fax: [phone]

## 2023-01-27 NOTE — Telephone Encounter (Signed)
Sent the PA to pt plan received this message:A PA is already in process for this member/drug. For further inquiries please contact the number on the back of the member prescription card. (Message [phone])

## 2023-03-06 ENCOUNTER — Other Ambulatory Visit (HOSPITAL_COMMUNITY): Payer: Self-pay

## 2023-03-12 ENCOUNTER — Telehealth: Payer: Self-pay

## 2023-03-12 ENCOUNTER — Ambulatory Visit (INDEPENDENT_AMBULATORY_CARE_PROVIDER_SITE_OTHER): Payer: Federal, State, Local not specified - PPO

## 2023-03-12 ENCOUNTER — Ambulatory Visit: Payer: Federal, State, Local not specified - PPO | Admitting: Podiatry

## 2023-03-12 ENCOUNTER — Other Ambulatory Visit (HOSPITAL_COMMUNITY): Payer: Self-pay

## 2023-03-12 DIAGNOSIS — M7750 Other enthesopathy of unspecified foot: Secondary | ICD-10-CM | POA: Diagnosis not present

## 2023-03-12 DIAGNOSIS — M7732 Calcaneal spur, left foot: Secondary | ICD-10-CM

## 2023-03-12 DIAGNOSIS — M7752 Other enthesopathy of left foot: Secondary | ICD-10-CM | POA: Diagnosis not present

## 2023-03-12 DIAGNOSIS — M7731 Calcaneal spur, right foot: Secondary | ICD-10-CM | POA: Diagnosis not present

## 2023-03-12 DIAGNOSIS — M79671 Pain in right foot: Secondary | ICD-10-CM

## 2023-03-12 DIAGNOSIS — M7751 Other enthesopathy of right foot: Secondary | ICD-10-CM

## 2023-03-12 DIAGNOSIS — M722 Plantar fascial fibromatosis: Secondary | ICD-10-CM | POA: Diagnosis not present

## 2023-03-12 NOTE — Progress Notes (Signed)
Subjective:   Patient ID: Mario Moore, male   DOB: 52 y.o.   MRN: [id number]   HPI Chief Complaint  Patient presents with   Foot Pain    Pt stated that he has been having some discomfort in her foot     52 year old male presents the office for above concerns.  He states he stands on concrete all day and by the end of the days when he has pain to his feet into his leg.  He recently did get a new pair of [employer] shoes which is been helping.  He wears [employer] for walking.  He states that when he hurt the bone to the same time.  He states is not really pain with so much is more pulsating.  No recent treatment.  No injuries.   Review of Systems  All other systems reviewed and are negative.  Past Medical History:  Diagnosis Date   Allergy    seasonal   Anemia    due to frequency of nose bleeds- not a problem now-uses OTC iron supplement   Complication of anesthesia    slow to wake up after surgery   History of seasonal allergies    tx. Cetirizine   Hx of adenomatous polyp of colon 07/06/2019   Hypothyroidism    Low back pain    gets acupuncture from Dr. Ernestina Patches at [facility]   Personal history of hereditary hemorrhagic telangiectasia (HHT)    sees Dr. Nicholes Rough at [facility]     Past Surgical History:  Procedure Laterality Date   COLONOSCOPY  07/01/2019   per Dr. Leone Payor, adenomatous polyp, repeat in 7 yrs    ESOPHAGOGASTRODUODENOSCOPY     KNEE ARTHROSCOPY WITH MEDIAL MENISECTOMY Left 01/09/2016   Procedure: LEFT KNEE ARTHROSCOPY WITH MEDIAL DEBRIDEMENT ;  Surgeon: Ollen Gross, MD;  Location: [facility];  Service: Orthopedics;  Laterality: Left;   LASIK Bilateral 1995   LUMBAR LAMINECTOMY  10/27/2020   per Dr. Marikay Alar, at L4-5   nasoseptolplasty     has a deviated septum   SPINE SURGERY     VASECTOMY       Current Outpatient Medications:    cetirizine (ZYRTEC) 10 MG tablet, Take 10 mg by mouth as needed. , Disp: , Rfl:    doxycycline  (MONODOX) 100 MG capsule, Take 1 capsule (100 mg total) by mouth 2 (two) times daily., Disp: 20 capsule, Rfl: 0   ferrous sulfate 325 (65 FE) MG tablet, Take 325 mg by mouth daily with breakfast. , Disp: , Rfl:    levothyroxine (SYNTHROID) 100 MCG tablet, TAKE 1 TABLET BY MOUTH EVERY DAY, Disp: 90 tablet, Rfl: 1   temazepam (RESTORIL) 30 MG capsule, TAKE 1 CAPSULE BY MOUTH EVERY NIGHT AT BEDTIME, Disp: 30 capsule, Rfl: 5  Allergies  Allergen Reactions   Aspirin     Thins out blood, has HHT   Ibuprofen Other (See Comments)    HHT           Objective:  Physical Exam  General: AAO x3, NAD  Dermatological: Skin is warm, dry and supple bilateral.  There are no open sores, no preulcerative lesions, no rash or signs of infection present.  Vascular: Dorsalis Pedis artery and Posterior Tibial artery pedal pulses are 2/4 bilateral with immedate capillary fill time. . There is no pain with calf compression, swelling, warmth, erythema.   Neruologic: Grossly intact via light touch bilateral.   Musculoskeletal: There is no specific area of  pinpoint tenderness noted today.  Get some discomfort in the arch of the foot on the medial aspect along the plantar fascia.  There is mild discomfort along the flexor tendons ankle as well but is no area pinpoint tenderness. Ankle, subtalar range of motion intact for any restrictions.  There is no significant edema.  There is no erythema.  Gait: Unassisted, Nonantalgic.       Assessment:   52 year old male with tendinitis, Plantar fasciitis     Plan:  -Treatment options discussed including all alternatives, risks, and complications -Etiology of symptoms were discussed -X-rays were obtained and reviewed with the patient.  3 views of the feet were obtained bilaterally.  There is minimal calcaneal spurring present.  There is no evidence of acute fracture. -We discussed anti-inflammatories as needed as well as icing of the day.  Discussed different  stretch exercises to help rehab the ankle tendons, plantar fascia.  We discussed using good arch support.  Hopefully the new shoes will be helpful we discussed adding different types of inserts inside the shoes.  Discussed over-the-counter discussed them.   Vivi Barrack DPM

## 2023-03-12 NOTE — Patient Instructions (Signed)

## 2023-03-12 NOTE — Telephone Encounter (Signed)
Patient Advocate Encounter  Prior Authorization for Temazepam 30mg  caps has been approved.    PA# approved for 90 caps for 90 days Effective dates: 03/12/23 through 03/11/24   Placed a call to the pharmacy to notify of the approval and the pharmacist stated they will need a new prescription written for 90 caps in order to fill for it.   [place] phone [phone]

## 2023-03-13 NOTE — Telephone Encounter (Signed)
Noted  

## 2023-04-14 DIAGNOSIS — D225 Melanocytic nevi of trunk: Secondary | ICD-10-CM | POA: Diagnosis not present

## 2023-04-14 DIAGNOSIS — L570 Actinic keratosis: Secondary | ICD-10-CM | POA: Diagnosis not present

## 2023-06-27 ENCOUNTER — Other Ambulatory Visit: Payer: Self-pay | Admitting: Family Medicine

## 2023-07-08 ENCOUNTER — Encounter (INDEPENDENT_AMBULATORY_CARE_PROVIDER_SITE_OTHER): Payer: Self-pay

## 2023-08-02 ENCOUNTER — Other Ambulatory Visit: Payer: Self-pay | Admitting: Family Medicine

## 2023-08-04 ENCOUNTER — Ambulatory Visit (INDEPENDENT_AMBULATORY_CARE_PROVIDER_SITE_OTHER): Payer: Federal, State, Local not specified - PPO | Admitting: Family Medicine

## 2023-08-04 ENCOUNTER — Encounter: Payer: Self-pay | Admitting: Family Medicine

## 2023-08-04 VITALS — BP 100/60 | HR 56 | Temp 97.5°F | Ht 72.0 in | Wt 199.4 lb

## 2023-08-04 DIAGNOSIS — D72819 Decreased white blood cell count, unspecified: Secondary | ICD-10-CM | POA: Diagnosis not present

## 2023-08-04 DIAGNOSIS — D649 Anemia, unspecified: Secondary | ICD-10-CM

## 2023-08-04 DIAGNOSIS — Z0000 Encounter for general adult medical examination without abnormal findings: Secondary | ICD-10-CM | POA: Diagnosis not present

## 2023-08-04 DIAGNOSIS — E039 Hypothyroidism, unspecified: Secondary | ICD-10-CM | POA: Diagnosis not present

## 2023-08-04 LAB — CBC WITH DIFFERENTIAL/PLATELET
Basophils Absolute: 0.1 10*3/uL (ref 0.0–0.1)
Basophils Relative: 1.9 % (ref 0.0–3.0)
Eosinophils Absolute: 0.3 10*3/uL (ref 0.0–0.7)
Eosinophils Relative: 9.3 % — ABNORMAL HIGH (ref 0.0–5.0)
HCT: 32.3 % — ABNORMAL LOW (ref 39.0–52.0)
Hemoglobin: 9.6 g/dL — ABNORMAL LOW (ref 13.0–17.0)
Lymphocytes Relative: 17 % (ref 12.0–46.0)
Lymphs Abs: 0.5 10*3/uL — ABNORMAL LOW (ref 0.7–4.0)
MCHC: 29.7 g/dL — ABNORMAL LOW (ref 30.0–36.0)
MCV: 69.3 fl — ABNORMAL LOW (ref 78.0–100.0)
Monocytes Absolute: 0.4 10*3/uL (ref 0.1–1.0)
Monocytes Relative: 13.1 % — ABNORMAL HIGH (ref 3.0–12.0)
Neutro Abs: 1.6 10*3/uL (ref 1.4–7.7)
Neutrophils Relative %: 58.7 % (ref 43.0–77.0)
Platelets: 221 10*3/uL (ref 150.0–400.0)
RBC: 4.66 Mil/uL (ref 4.22–5.81)
RDW: 20.7 % — ABNORMAL HIGH (ref 11.5–15.5)
WBC: 2.8 10*3/uL — ABNORMAL LOW (ref 4.0–10.5)

## 2023-08-04 LAB — LIPID PANEL
Cholesterol: 129 mg/dL (ref 0–200)
HDL: 62 mg/dL (ref >39.00)
LDL Cholesterol: 60 mg/dL (ref 0–99)
NonHDL: 67.46
Total CHOL/HDL Ratio: 2
Triglycerides: 37 mg/dL (ref 0.0–149.0)
VLDL: 7.4 mg/dL (ref 0.0–40.0)

## 2023-08-04 LAB — HEPATIC FUNCTION PANEL
ALT: 70 U/L — ABNORMAL HIGH (ref 0–53)
AST: 40 U/L — ABNORMAL HIGH (ref 0–37)
Albumin: 3.9 g/dL (ref 3.5–5.2)
Alkaline Phosphatase: 114 U/L (ref 39–117)
Bilirubin, Direct: 0.2 mg/dL (ref 0.0–0.3)
Total Bilirubin: 0.6 mg/dL (ref 0.2–1.2)
Total Protein: 6.5 g/dL (ref 6.0–8.3)

## 2023-08-04 LAB — BASIC METABOLIC PANEL
BUN: 17 mg/dL (ref 6–23)
CO2: 28 mEq/L (ref 19–32)
Calcium: 9.1 mg/dL (ref 8.4–10.5)
Chloride: 106 mEq/L (ref 96–112)
Creatinine, Ser: 0.89 mg/dL (ref 0.40–1.50)
GFR: 97.37 mL/min (ref >60.00)
Glucose, Bld: 101 mg/dL — ABNORMAL HIGH (ref 70–99)
Potassium: 4.4 mEq/L (ref 3.5–5.1)
Sodium: 140 mEq/L (ref 135–145)

## 2023-08-04 LAB — HEMOGLOBIN A1C: Hgb A1c MFr Bld: 5.3 % (ref 4.6–6.5)

## 2023-08-04 LAB — T4, FREE: Free T4: 1 ng/dL (ref 0.60–1.60)

## 2023-08-04 LAB — TSH: TSH: 0.53 u[IU]/mL (ref 0.35–5.50)

## 2023-08-04 LAB — T3, FREE: T3, Free: 4.8 pg/mL — ABNORMAL HIGH (ref 2.3–4.2)

## 2023-08-04 LAB — VITAMIN B12: Vitamin B-12: 528 pg/mL (ref 211–911)

## 2023-08-04 LAB — PSA: PSA: 0.42 ng/mL (ref 0.10–4.00)

## 2023-08-04 MED ORDER — TEMAZEPAM 30 MG PO CAPS: 30.0000 mg | ORAL_CAPSULE | Freq: Every day | ORAL | 1 refills | Status: DC

## 2023-08-04 MED ORDER — AZITHROMYCIN 250 MG PO TABS: ORAL_TABLET | ORAL | 0 refills | Status: DC

## 2023-08-04 NOTE — Progress Notes (Signed)
Subjective:    Patient ID: Mario Moore, male    DOB: November 02, 1971, 52 y.o.   MRN: [id number]  HPI Here for a well exam. He feels well in general. He does mention some stuffiness in the left ear for the past few weeks. No pain.    Review of Systems  Constitutional: Negative.   HENT: Negative.    Eyes: Negative.   Respiratory: Negative.    Cardiovascular: Negative.   Gastrointestinal: Negative.   Genitourinary: Negative.   Musculoskeletal: Negative.   Skin: Negative.   Neurological: Negative.   Psychiatric/Behavioral: Negative.         Objective:   Physical Exam Constitutional:      General: He is not in acute distress.    Appearance: Normal appearance. He is well-developed. He is not diaphoretic.  HENT:     Head: Normocephalic and atraumatic.     Right Ear: External ear normal.     Left Ear: External ear normal.     Nose: Nose normal.     Mouth/Throat:     Pharynx: No oropharyngeal exudate.  Eyes:     General: No scleral icterus.       Right eye: No discharge.        Left eye: No discharge.     Conjunctiva/sclera: Conjunctivae normal.     Pupils: Pupils are equal, round, and reactive to light.  Neck:     Thyroid: No thyromegaly.     Vascular: No JVD.     Trachea: No tracheal deviation.  Cardiovascular:     Rate and Rhythm: Normal rate and regular rhythm.     Pulses: Normal pulses.     Heart sounds: Normal heart sounds. No murmur heard.    No friction rub. No gallop.  Pulmonary:     Effort: Pulmonary effort is normal. No respiratory distress.     Breath sounds: Normal breath sounds. No wheezing or rales.  Chest:     Chest wall: No tenderness.  Abdominal:     General: Bowel sounds are normal. There is no distension.     Palpations: Abdomen is soft. There is no mass.     Tenderness: There is no abdominal tenderness. There is no guarding or rebound.  Genitourinary:    Penis: Normal. No tenderness.      Testes: Normal.     Prostate: Normal.     Rectum:  Normal. Guaiac result negative.  Musculoskeletal:        General: No tenderness. Normal range of motion.     Cervical back: Neck supple.  Lymphadenopathy:     Cervical: No cervical adenopathy.  Skin:    General: Skin is warm and dry.     Coloration: Skin is not pale.     Findings: No erythema or rash.  Neurological:     General: No focal deficit present.     Mental Status: He is alert and oriented to person, place, and time.     Cranial Nerves: No cranial nerve deficit.     Motor: No abnormal muscle tone.     Coordination: Coordination normal.     Deep Tendon Reflexes: Reflexes are normal and symmetric. Reflexes normal.  Psychiatric:        Mood and Affect: Mood normal.        Behavior: Behavior normal.        Thought Content: Thought content normal.        Judgment: Judgment normal.  Assessment & Plan:  Well exam. We discussed diet and exercise. Get fasting labs. He has some eustachian tube dysfunction, so I suggested he add Sudafed to his regimen as needed.  Gershon Crane, MD

## 2023-08-05 ENCOUNTER — Encounter: Payer: Self-pay | Admitting: Family Medicine

## 2023-08-07 NOTE — Addendum Note (Signed)
Addended by: Gershon Crane A on: 08/07/2023 08:07 AM   Modules accepted: Orders

## 2023-08-27 ENCOUNTER — Encounter: Payer: Self-pay | Admitting: Family Medicine

## 2023-08-28 MED ORDER — LEVOTHYROXINE SODIUM 75 MCG PO TABS: 75.0000 ug | ORAL_TABLET | Freq: Every day | ORAL | 3 refills | Status: DC

## 2023-08-28 NOTE — Telephone Encounter (Signed)
New Rx for Levothyroxine 75 mcg sent to pt pharmacy. Pt notified

## 2023-10-23 NOTE — Progress Notes (Unsigned)
[name] CONSULT NOTE  Patient Care Team: [name], MD as PCP - General (Family Medicine)  ASSESSMENT & PLAN:  @AGE  male with history of HHT, Hypothyroidism being seen for iron deficiency anemia.  Relevant history: History of HHT with epistaxis Last colonoscopy: 2020 Last EGD: 2020  The mechanism of IDA is due to either blood loss or decreased absorptive mechanism or both. He has HHT with epistaxis. Worsening microcytosis in Aug 2024 vs Aug 2023.   We discussed some of the risks, benefits, and alternatives of intravenous iron infusions if needed. The patient is symptomatic from anemia and the iron level is critically low. He tolerated oral iron supplement poorly and desires to achieved higher levels of iron faster for adequate hematopoesis. Some of the side-effects to be expected including risks of infusion reactions, phlebitis, headaches, nausea and fatigue.  The patient is willing to proceed if needed. He reports eye needling sensation resolved recently which is his symptoms with iron supplement.  IDA Continue oral iron CBC, Iron, TIBC, ferritin  HHT Negative MRI brain and ECHO screening in 2021. Colonoscopy and EGD in 2020 Monitor bleeding  Epistaxis His epistaxis severity score (EES) was calculated to be 4.53 on 03/14/2014. Now Score 2.25 today. Continue bedside humidifier overnight regularly  Continue bacitracin twice daily.  Ayr gel as needed.  All questions were answered. The patient knows to call the clinic with any problems, questions or concerns.  Follow up in April with CBC, ferritin a few days before visit. Earlier if needed.  [name], MD 11/18/202410:28 AM   CHIEF COMPLAINTS/PURPOSE OF CONSULTATION:  Anemia  HISTORY OF PRESENTING ILLNESS:  Mario Moore 52 y.o. male is here because of anemia.  Records show intermittent microcytosis with anemia, iron deficiency.  Reported history of HHT.  Mario Moore goes by Mario Moore has history of  HHT. He has seen Dr. [name] at [facility] for consultation. Report history of nosebleeding. Previoulsy was recommended using a bedside humidifier overnight regularly and prescribed topical estriol in petrolatum. He reports it does not have difference between bacitracin and Ayr gel. They have been helping.  A red dot on right side of lip bleed once a while. He clamps and put ice on it.  He had not noticed any recent bleeding such as melena, hematuria or hematochezia. No chest pain, new headache.  He reports forgetting to take iron at time. He is taking every other day. He feels having needles on eyes if iron is low and more tired. He is taking iron now consistently. He has not needed iv iron.  07/01/19 Colonoscopy  - One diminutive polyp in the transverse colon, removed with a cold snare. Resected and retrieved. - The examination was otherwise normal on direct and retroflexion views. HE HAS A CHRONIC BLOOD LOSS ANEMIA BUT WE KNOW THAT IS RELATED TO HIS HHT AND EPISTAXIS  07/01/19 EGD - Normal esophagus. - Normal stomach. - Normal examined duodenum. - No specimens collected. NO TELANGIECTASIA HERE - SEEMS LIKE EPISTAXIS IS CAUSE OF ANEMIA - HE WAS FINE UNTIL HE STOPPED IRON SUPPLEMENTS -   HHT Screening: 02/14/20 MRI brain from [facility]: FINDINGS:  There is no focal parenchymal signal abnormality. Ventricles are normal in size. There is no midline shift. No extra-axial fluid collection. No evidence of intracranial hemorrhage. No diffusion weighted signal abnormality to suggest acute infarct. No focal abnormalities on asl or perfusion imaging.   No mass. There is no abnormal enhancement.   Intracranial MRA does not demonstrate any  high grade stenoses. No aneurysm.   02/14/20 ECHO Summary   1. The left ventricle is normal in size with normal wall thickness.    2. The left ventricular systolic function is normal, LVEF is visually  estimated at > 55%.    3. The mitral valve leaflets are mildly thickened with  normal leaflet  mobility.   4. There is mild mitral valve regurgitation.    5. The aortic valve is trileaflet with mildly thickened leaflets with normal  excursion.   6. There is mild aortic regurgitation.    7. The left atrium is mildly dilated in size.    8. The right ventricle is normal in size, with normal systolic function.    9. No evidence of an interatrial communication or intrapulmonary shunt.    10. Agitated saline study is negative.    MEDICAL HISTORY:  Past Medical History:  Diagnosis Date   Allergy    seasonal   Anemia    due to frequency of nose bleeds- not a problem now-uses OTC iron supplement   Complication of anesthesia    slow to wake up after surgery   History of seasonal allergies    tx. Cetirizine   Hx of adenomatous polyp of colon 07/06/2019   Hypothyroidism    Low back pain    gets acupuncture from Dr. Ernestina Patches at [facility]   Personal history of hereditary hemorrhagic telangiectasia (HHT)    sees Dr. Nicholes Rough at [facility]     SURGICAL HISTORY: Past Surgical History:  Procedure Laterality Date   COLONOSCOPY  07/01/2019   per Dr. Leone Payor, adenomatous polyp, repeat in 7 yrs    ESOPHAGOGASTRODUODENOSCOPY     KNEE ARTHROSCOPY WITH MEDIAL MENISECTOMY Left 01/09/2016   Procedure: LEFT KNEE ARTHROSCOPY WITH MEDIAL DEBRIDEMENT ;  Surgeon: Ollen Gross, MD;  Location: [facility];  Service: Orthopedics;  Laterality: Left;   LASIK Bilateral 1995   LUMBAR LAMINECTOMY  10/27/2020   per Dr. Marikay Alar, at L4-5   nasoseptolplasty     has a deviated septum   SPINE SURGERY     VASECTOMY      SOCIAL HISTORY: Social History   Socioeconomic History   Marital status: Married    Spouse name: Not on file   Number of children: 2   Years of education: Not on file   Highest education level: Not on file  Occupational History   Occupation: [employer]  Tobacco Use   Smoking status: Never   Smokeless tobacco: Never  Substance and Sexual  Activity   Alcohol use: Yes    Alcohol/week: 6.0 standard drinks of alcohol    Types: 6 Cans of beer per week    Comment: beer on weekends -occ.   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   2 caffeine/day   Social Determinants of Corporate investment banker Strain: Not on file  Food Insecurity: Not on file  Transportation Needs: Not on file  Physical Activity: Not on file  Stress: Not on file  Social Connections: Unknown (04/22/2022)   Received from [facility]   Social Network    Social Network: Not on file  Intimate Partner Violence: Unknown (03/14/2022)   Received from [facility]   HITS  Physically Hurt: Not on file    Insult or Talk Down To: Not on file    Threaten Physical Harm: Not on file    Scream or Curse: Not on file    FAMILY HISTORY: Family History  Problem Relation Age of Onset   Prostate cancer Father    Irritable bowel syndrome Mother    Osler-Weber-Rendu syndrome Mother        HHT   Osler-Weber-Rendu syndrome Sister        HHT   Osler-Weber-Rendu syndrome Maternal Grandfather        HHT    ALLERGIES:  is allergic to aspirin and ibuprofen.  MEDICATIONS:  Current Outpatient Medications  Medication Sig Dispense Refill   cetirizine (ZYRTEC) 10 MG tablet Take 10 mg by mouth as needed.      ferrous sulfate 325 (65 FE) MG tablet Take 325 mg by mouth daily with breakfast.      levothyroxine (SYNTHROID) 75 MCG tablet Take 1 tablet (75 mcg total) by mouth daily. 90 tablet 3   temazepam (RESTORIL) 30 MG capsule Take 1 capsule (30 mg total) by mouth at bedtime. 90 capsule 1   No current facility-administered medications for this visit.    REVIEW OF SYSTEMS:   Respiratory: Denies shortness of breath Cardiovascular: Denies chest pain, chest  discomfort Gastrointestinal:  Denies nausea, abdominal pain, melena or bloody stools GU: no hematuria Skin: skin color change All other systems were reviewed with the patient and are negative.  PHYSICAL EXAMINATION:  Vitals:   10/26/23 0955  BP: 117/69  Pulse: 64  Resp: 13  Temp: (!) 97.3 F (36.3 C)  SpO2: 100%   Filed Weights   10/26/23 0955  Weight: 201 lb 1.6 oz (91.2 kg)    GENERAL: alert, no distress and comfortable Mouth: Telangiectasias are noted over the lip, upper palate  SKIN: Telangiectasias are noted over the fingers.  EYES:  sclera clear LUNGS: normal breathing effort. No wheeze or rales HEART: regular rate & rhythm ABDOMEN: abdomen soft, non-tender and nondistended  RADIOGRAPHIC STUDIES: I have personally reviewed the radiological images as listed and agreed with the findings in the report. No results found.

## 2023-10-26 ENCOUNTER — Telehealth: Payer: Self-pay | Admitting: *Deleted

## 2023-10-26 ENCOUNTER — Other Ambulatory Visit: Payer: Self-pay

## 2023-10-26 ENCOUNTER — Inpatient Hospital Stay: Payer: Federal, State, Local not specified - PPO

## 2023-10-26 VITALS — BP 117/69 | HR 64 | Temp 97.3°F | Resp 13 | Wt 201.1 lb

## 2023-10-26 DIAGNOSIS — D72818 Other decreased white blood cell count: Secondary | ICD-10-CM

## 2023-10-26 DIAGNOSIS — I780 Hereditary hemorrhagic telangiectasia: Secondary | ICD-10-CM

## 2023-10-26 DIAGNOSIS — Z8042 Family history of malignant neoplasm of prostate: Secondary | ICD-10-CM | POA: Insufficient documentation

## 2023-10-26 DIAGNOSIS — D509 Iron deficiency anemia, unspecified: Secondary | ICD-10-CM | POA: Diagnosis not present

## 2023-10-26 DIAGNOSIS — D500 Iron deficiency anemia secondary to blood loss (chronic): Secondary | ICD-10-CM

## 2023-10-26 DIAGNOSIS — H9312 Tinnitus, left ear: Secondary | ICD-10-CM | POA: Diagnosis not present

## 2023-10-26 DIAGNOSIS — R040 Epistaxis: Secondary | ICD-10-CM

## 2023-10-26 DIAGNOSIS — H903 Sensorineural hearing loss, bilateral: Secondary | ICD-10-CM | POA: Diagnosis not present

## 2023-10-26 LAB — CBC WITH DIFFERENTIAL (CANCER CENTER ONLY)
Abs Immature Granulocytes: 0.01 10*3/uL (ref 0.00–0.07)
Basophils Absolute: 0 10*3/uL (ref 0.0–0.1)
Basophils Relative: 1 %
Eosinophils Absolute: 0.2 10*3/uL (ref 0.0–0.5)
Eosinophils Relative: 6 %
HCT: 43.4 % (ref 39.0–52.0)
Hemoglobin: 13 g/dL (ref 13.0–17.0)
Immature Granulocytes: 0 %
Lymphocytes Relative: 22 %
Lymphs Abs: 0.6 10*3/uL — ABNORMAL LOW (ref 0.7–4.0)
MCH: 24.2 pg — ABNORMAL LOW (ref 26.0–34.0)
MCHC: 30 g/dL (ref 30.0–36.0)
MCV: 80.7 fL (ref 80.0–100.0)
Monocytes Absolute: 0.3 10*3/uL (ref 0.1–1.0)
Monocytes Relative: 11 %
Neutro Abs: 1.6 10*3/uL — ABNORMAL LOW (ref 1.7–7.7)
Neutrophils Relative %: 60 %
Platelet Count: 221 10*3/uL (ref 150–400)
RBC: 5.38 MIL/uL (ref 4.22–5.81)
RDW: 22 % — ABNORMAL HIGH (ref 11.5–15.5)
WBC Count: 2.7 10*3/uL — ABNORMAL LOW (ref 4.0–10.5)
nRBC: 0 % (ref 0.0–0.2)

## 2023-10-26 LAB — IRON AND IRON BINDING CAPACITY (CC-WL,HP ONLY)
Iron: 80 ug/dL (ref 45–182)
Saturation Ratios: 16 % — ABNORMAL LOW (ref 17.9–39.5)
TIBC: 498 ug/dL — ABNORMAL HIGH (ref 250–450)
UIBC: 418 ug/dL — ABNORMAL HIGH (ref 117–376)

## 2023-10-26 LAB — FOLATE: Folate: 14.9 ng/mL (ref >5.9)

## 2023-10-26 LAB — FERRITIN: Ferritin: 37 ng/mL (ref 24–336)

## 2023-10-26 NOTE — Telephone Encounter (Signed)
-----   Message from [name] sent at 10/26/2023  4:12 PM EST ----- Please let him know there is improvement of hemoglobin. Ok to continue the iron he has been taking. Borderline low white blood cell count stable. No concerns. Thanks.

## 2023-10-26 NOTE — Assessment & Plan Note (Signed)
Negative MRI brain and ECHO screening in 2021. Colonoscopy and EGD in 2020 Monitor bleeding

## 2023-10-26 NOTE — Assessment & Plan Note (Signed)
Continue oral iron CBC, Iron, TIBC, ferritin

## 2023-10-26 NOTE — Telephone Encounter (Signed)
Notified of message below

## 2023-10-29 ENCOUNTER — Telehealth: Payer: Self-pay

## 2023-10-29 NOTE — Telephone Encounter (Signed)
Patient is aware of scheduled appointment times/dates

## 2024-02-08 ENCOUNTER — Encounter: Payer: Self-pay | Admitting: Family Medicine

## 2024-02-09 ENCOUNTER — Other Ambulatory Visit: Payer: Self-pay | Admitting: Family Medicine

## 2024-02-10 MED ORDER — TEMAZEPAM 30 MG PO CAPS: 30.0000 mg | ORAL_CAPSULE | Freq: Every day | ORAL | 1 refills | Status: DC

## 2024-02-10 NOTE — Telephone Encounter (Signed)
 Done

## 2024-02-16 ENCOUNTER — Telehealth: Payer: Self-pay

## 2024-02-16 NOTE — Telephone Encounter (Signed)
Patient rescheduled appts. 

## 2024-02-23 ENCOUNTER — Other Ambulatory Visit: Payer: Federal, State, Local not specified - PPO

## 2024-02-26 ENCOUNTER — Ambulatory Visit: Payer: Federal, State, Local not specified - PPO

## 2024-03-01 ENCOUNTER — Inpatient Hospital Stay

## 2024-03-01 DIAGNOSIS — D509 Iron deficiency anemia, unspecified: Secondary | ICD-10-CM | POA: Diagnosis not present

## 2024-03-01 DIAGNOSIS — D500 Iron deficiency anemia secondary to blood loss (chronic): Secondary | ICD-10-CM

## 2024-03-01 LAB — CBC WITH DIFFERENTIAL (CANCER CENTER ONLY)
Abs Immature Granulocytes: 0.01 10*3/uL (ref 0.00–0.07)
Basophils Absolute: 0.1 10*3/uL (ref 0.0–0.1)
Basophils Relative: 2 %
Eosinophils Absolute: 0.3 10*3/uL (ref 0.0–0.5)
Eosinophils Relative: 8 %
HCT: 40.6 % (ref 39.0–52.0)
Hemoglobin: 12.7 g/dL — ABNORMAL LOW (ref 13.0–17.0)
Immature Granulocytes: 0 %
Lymphocytes Relative: 18 %
Lymphs Abs: 0.6 10*3/uL — ABNORMAL LOW (ref 0.7–4.0)
MCH: 28.9 pg (ref 26.0–34.0)
MCHC: 31.3 g/dL (ref 30.0–36.0)
MCV: 92.3 fL (ref 80.0–100.0)
Monocytes Absolute: 0.4 10*3/uL (ref 0.1–1.0)
Monocytes Relative: 12 %
Neutro Abs: 2 10*3/uL (ref 1.7–7.7)
Neutrophils Relative %: 60 %
Platelet Count: 230 10*3/uL (ref 150–400)
RBC: 4.4 MIL/uL (ref 4.22–5.81)
RDW: 15 % (ref 11.5–15.5)
WBC Count: 3.4 10*3/uL — ABNORMAL LOW (ref 4.0–10.5)
nRBC: 0 % (ref 0.0–0.2)

## 2024-03-01 LAB — FERRITIN: Ferritin: 44 ng/mL (ref 24–336)

## 2024-03-03 NOTE — Progress Notes (Addendum)
 [facility] OFFICE PROGRESS NOTE  Patient Care Team: [name], MD as PCP - General (Family Medicine)  53 y.o.male with history of HHT, Hypothyroidism being follow up for for iron deficiency anemia. This is a telephone visit at patient request.   Relevant history: History of HHT with epistaxis Last colonoscopy: 2020 Last EGD: 2020  Patient location: in [place] Physician location: in [facility]  Updated labs showed ferritin 44. Hgb 12.7  Clinically stable. Epistaxis controlled. No other bleeding or new symptoms. Assessment & Plan Other iron deficiency anemia Continue oral iron CBC, Iron, TIBC, ferritin in about 6 months Hereditary hemorrhagic telangiectasia (HCC) Brain: Negative MRI brain and ECHO screening in 2021. No new symptoms GI: colonoscopy and EGD in 2020. GI recommended follow up at age 19 Liver: no AVM from MRI in 2018.  Lung: report negative from patient. No new symptoms. Monitor bleeding  Epistaxis His epistaxis severity score (EES) was calculated to be 4.53 on 03/14/2014. Now Score 1.52 today. Continue bedside humidifier overnight regularly  Moisturizer as needed Ayr gel as needed.  Total time: 22 minutes  Orders Placed This Encounter  Procedures   CBC with Differential (Cancer Center Only)    Standing Status:   Future    Expiration Date:   03/08/2025   Ferritin    Standing Status:   Future    Expiration Date:   03/08/2025     [name], MD  INTERVAL HISTORY:  He has been taking iron once daily. Not much fatigue more than past. No blood stool. Epistaxis every now and then he is used. Not more than usual. Uses saline gel and vaseline.   HHT History HHT Screening: Records show intermittent microcytosis with anemia, iron deficiency.  Reported history of HHT.   Mario Moore goes by Mario Moore has history of HHT. He has seen Dr. [name] at [facility] for consultation. Report history of nosebleeding. Previoulsy was recommended using a  bedside humidifier overnight regularly and prescribed topical estriol in petrolatum. He reports it does not have difference between bacitracin and Ayr gel. They have been helping.  A red dot on right side of lip bleed once a while. He clamps and put ice on it.  Report he had screening for lung AVM and was negative.   He had not noticed any recent bleeding such as melena, hematuria or hematochezia. No chest pain, new headache.   He reports forgetting to take iron at time. He is taking every other day. He feels having needles on eyes if iron is low and more tired. He is taking iron now consistently. He has not needed iv iron.   07/01/19 Colonoscopy  - One diminutive polyp in the transverse colon, removed with a cold snare. Resected and retrieved. - The examination was otherwise normal on direct and retroflexion views. HE HAS A CHRONIC BLOOD LOSS ANEMIA BUT WE KNOW THAT IS RELATED TO HIS HHT AND EPISTAXIS   07/01/19 EGD - Normal esophagus. - Normal stomach. - Normal examined duodenum. - No specimens collected. NO TELANGIECTASIA HERE - SEEMS LIKE EPISTAXIS IS CAUSE OF ANEMIA - HE WAS FINE UNTIL HE STOPPED IRON SUPPLEMENTS -  02/14/20 MRI brain from [facility]: FINDINGS:  There is no focal parenchymal signal abnormality. Ventricles are normal in size. There is no midline shift. No extra-axial fluid collection. No evidence of intracranial hemorrhage. No diffusion weighted signal abnormality to suggest acute infarct. No focal abnormalities on asl or perfusion imaging.   No mass. There is no  abnormal enhancement.   Intracranial MRA does not demonstrate any high grade stenoses. No aneurysm.    02/14/20 ECHO Summary   1. The left ventricle is normal in size with normal wall thickness.    2. The left ventricular systolic function is normal, LVEF is visually  estimated at > 55%.    3. The mitral valve leaflets are mildly thickened with normal leaflet  mobility.   4. There is mild mitral valve regurgitation.     5. The aortic valve is trileaflet with mildly thickened leaflets with normal  excursion.   6. There is mild aortic regurgitation.    7. The left atrium is mildly dilated in size.    8. The right ventricle is normal in size, with normal systolic function.    9. No evidence of an interatrial communication or intrapulmonary shunt.    10. Agitated saline study is negative.   Relevant data reviewed during this visit included labs.

## 2024-03-03 NOTE — Assessment & Plan Note (Addendum)
 Continue oral iron CBC, Iron, TIBC, ferritin in about 6 months

## 2024-03-03 NOTE — Assessment & Plan Note (Addendum)
 Brain: Negative MRI brain and ECHO screening in 2021. No new symptoms GI: colonoscopy and EGD in 2020. GI recommended follow up at age 53 Liver: no AVM from MRI in 2018.  Lung: report negative from patient. No new symptoms. Monitor bleeding

## 2024-03-08 ENCOUNTER — Inpatient Hospital Stay

## 2024-03-08 DIAGNOSIS — D508 Other iron deficiency anemias: Secondary | ICD-10-CM | POA: Diagnosis not present

## 2024-03-08 DIAGNOSIS — I780 Hereditary hemorrhagic telangiectasia: Secondary | ICD-10-CM | POA: Diagnosis not present

## 2024-03-09 ENCOUNTER — Telehealth: Payer: Self-pay

## 2024-03-09 NOTE — Telephone Encounter (Signed)
 Patient scheduled appointments. Patient is aware of all appointment details. Patient requested a telephone follow up appointment.

## 2024-03-11 ENCOUNTER — Telehealth: Payer: Self-pay | Admitting: Family Medicine

## 2024-03-11 DIAGNOSIS — R079 Chest pain, unspecified: Secondary | ICD-10-CM

## 2024-03-11 NOTE — Telephone Encounter (Signed)
 Done

## 2024-03-18 ENCOUNTER — Ambulatory Visit: Attending: Cardiology | Admitting: Cardiology

## 2024-03-18 ENCOUNTER — Encounter: Payer: Self-pay | Admitting: Cardiology

## 2024-03-18 VITALS — BP 106/61 | HR 60 | Resp 16 | Ht 72.0 in | Wt 201.8 lb

## 2024-03-18 DIAGNOSIS — K219 Gastro-esophageal reflux disease without esophagitis: Secondary | ICD-10-CM

## 2024-03-18 DIAGNOSIS — M940 Chondrocostal junction syndrome [Tietze]: Secondary | ICD-10-CM | POA: Diagnosis not present

## 2024-03-18 DIAGNOSIS — Z566 Other physical and mental strain related to work: Secondary | ICD-10-CM | POA: Diagnosis not present

## 2024-03-18 MED ORDER — OMEPRAZOLE 20 MG PO CPDR: 20.0000 mg | DELAYED_RELEASE_CAPSULE | Freq: Every day | ORAL | 0 refills | Status: DC | PRN

## 2024-03-18 NOTE — Progress Notes (Signed)
 Cardiology Office Note:  .   Date:  03/19/2024  ID:  Mario Moore, DOB Sep 15, 1971, MRN [id number] PCP: Donley Furth, MD  [facility] Providers Cardiologist:  Knox Perl, MD   History of Present Illness: .   Mario Moore is a 53 y.o.  with a history of hereditary hemolytic thrombocytopenia (HHT), presents with intermittent chest pain that started three weeks ago. The pain, initially pinpoint in the sternum, has since expanded and is associated with bloating and burping. 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 He is considering retirement due to the stress.  He noticed that taking iron before bed while lying down seemed to exacerbate the chest pain.  Discussed the use of AI scribe software for clinical note transcription with the patient, who gave verbal consent to proceed.  Labs   Lab Results  Component Value Date   CHOL 129 08/04/2023   HDL 62.00 08/04/2023   LDLCALC 60 08/04/2023   TRIG 37.0 08/04/2023   CHOLHDL 2 08/04/2023   Lab Results  Component Value Date   NA 140 08/04/2023   K 4.4 08/04/2023   CO2 28 08/04/2023   GLUCOSE 101 (H) 08/04/2023   BUN 17 08/04/2023   CREATININE 0.89 08/04/2023   CALCIUM 9.1 08/04/2023   GFR 97.37 08/04/2023      Latest Ref Rng & Units 08/04/2023    8:38 AM 07/30/2022    8:29 AM 07/29/2021    2:34 PM  BMP  Glucose 70 - 99 mg/dL 782  91  80   BUN 6 - 23 mg/dL 17  18  12    Creatinine 0.40 - 1.50 mg/dL 9.56  2.13  0.86   Sodium 135 - 145 mEq/L 140  138  139   Potassium 3.5 - 5.1 mEq/L 4.4  4.2  4.1   Chloride 96 - 112 mEq/L 106  102  102   CO2 19 - 32 mEq/L 28  27  28    Calcium 8.4 - 10.5 mg/dL 9.1  9.1  9.6       Latest Ref Rng & Units 03/01/2024    7:51 AM 10/26/2023   10:30 AM 08/04/2023    8:38 AM  CBC  WBC 4.0 - 10.5 K/uL 3.4  2.7  2.8   Hemoglobin 13.0 - 17.0 g/dL 57.8  46.9  9.6   Hematocrit 39.0 - 52.0 % 40.6  43.4  32.3    Platelets 150 - 400 K/uL 230  221  221.0    Lab Results  Component Value Date   HGBA1C 5.3 08/04/2023    Lab Results  Component Value Date   TSH 0.53 08/04/2023    Review of Systems  Cardiovascular:  Positive for chest pain. Negative for dyspnea on exertion and leg swelling.  Psychiatric/Behavioral:  The patient is nervous/anxious.    Physical Exam:   VS:  BP 106/61 (BP Location: Left Arm, Patient Position: Sitting, Cuff Size: Normal)   Pulse 60   Resp 16   Ht 6' (1.829 m)   Wt 201 lb 12.8 oz (91.5 kg)   SpO2 97%   BMI 27.37 kg/m    Wt Readings from Last 3 Encounters:  03/18/24 201 lb 12.8 oz (91.5 kg)  10/26/23 201 lb 1.6 oz (91.2 kg)  08/04/23 199 lb 6.4 oz (90.4 kg)    Physical Exam Neck:     Vascular: No carotid bruit or JVD.  Cardiovascular:  Rate and Rhythm: Normal rate and regular rhythm.     Pulses: Intact distal pulses.     Heart sounds: Normal heart sounds. No murmur heard.    No gallop.  Pulmonary:     Effort: Pulmonary effort is normal.     Breath sounds: Normal breath sounds.  Chest:     Chest wall: Tenderness (left costochondral point tenderness) present.  Abdominal:     General: Bowel sounds are normal.     Palpations: Abdomen is soft.  Musculoskeletal:     Right lower leg: No edema.     Left lower leg: No edema.    Studies Reviewed: Aaron Aas    EKG:    EKG Interpretation Date/Time:  Friday March 18 2024 10:49:06 EDT Ventricular Rate:  65 PR Interval:  142 QRS Duration:  96 QT Interval:  400 QTC Calculation: 416 R Axis:   18  Text Interpretation: EKG 03/18/2024: Normal sinus rhythm with PAC in trigeminal pattern, incomplete right bundle branch block, otherwise normal EKG. Confirmed by Kester Stimpson, Jagadeesh ([id number]) on 03/18/2024 11:10:57 AM    Medications and allergies    Allergies  Allergen Reactions   Aspirin     Thins out blood, has HHT   Ibuprofen Other (See Comments)    HHT    Current Outpatient Medications:    cetirizine (ZYRTEC)  10 MG tablet, Take 10 mg by mouth as needed. , Disp: , Rfl:    ferrous sulfate 325 (65 FE) MG tablet, Take 325 mg by mouth daily with breakfast. , Disp: , Rfl:    levothyroxine (SYNTHROID) 75 MCG tablet, Take 1 tablet (75 mcg total) by mouth daily., Disp: 90 tablet, Rfl: 3   omeprazole (PRILOSEC) 20 MG capsule, Take 1 capsule (20 mg total) by mouth daily as needed (Heart burn)., Disp: 90 capsule, Rfl: 0   temazepam (RESTORIL) 30 MG capsule, Take 1 capsule (30 mg total) by mouth at bedtime., Disp: 90 capsule, Rfl: 1   Meds ordered this encounter  Medications   omeprazole (PRILOSEC) 20 MG capsule    Sig: Take 1 capsule (20 mg total) by mouth daily as needed (Heart burn).    Dispense:  90 capsule    Refill:  0     There are no discontinued medications.   ASSESSMENT AND PLAN: .      ICD-10-CM   1. Costochondritis  M94.0 EKG 12-Lead    2. Gastroesophageal reflux disease without esophagitis  K21.9 omeprazole (PRILOSEC) 20 MG capsule    3. Work-related stress  Z56.6       1. Costochondritis (Primary) He experiences intermittent chest pain localized to the sternum, musculoskeletal in nature, tender to palpation, likely due to recent heavy lifting and manual labor. EKG and blood work are normal, indicating no cardiac involvement. This is assessed as costochondritis, a benign condition. Recommend acetaminophen for pain relief as needed and advise returning if pain persists or worsens. - EKG 12-Lead  2. Gastroesophageal reflux disease without esophagitis He has symptoms of acid reflux, including burping and bloating, possibly exacerbated by iron supplementation and stress. Relief with antacids indicates acid-related discomfort. Discussed potential dietary and stress-related factors. Prescribe omeprazole or another proton pump inhibitor, to be taken on an empty stomach for 3-4 days during episodes of heartburn. Advise keeping antacids at home for immediate relief of acid reflux symptoms. Educate  on lifestyle modifications to reduce reflux symptoms, such as avoiding lying down immediately after taking iron supplements. - omeprazole (PRILOSEC) 20 MG capsule; Take 1 capsule (20  mg total) by mouth daily as needed (Heart burn).  Dispense: 90 capsule; Refill: 0  Work-related Stress   He experiences significant stress related to his employment as a Advice worker, following a career in [employer]. Discussed potential benefits of psychological support to manage stress and improve quality of life. Recommend consultation with a psychologist for stress management and coping strategies.  Signed,  Knox Perl, MD, [facility] 03/19/2024, 9:35 AM [facility] [address] [place], [place] [address] Phone: [phone]. Fax:  [phone]

## 2024-03-18 NOTE — Patient Instructions (Signed)
 Medication Instructions:  Your physician recommends that you continue on your current medications as directed. Please refer to the Current Medication list given to you today.  *If you need a refill on your cardiac medications before your next appointment, please call your pharmacy*  Lab Work: none If you have labs (blood work) drawn today and your tests are completely normal, you will receive your results only by: MyChart Message (if you have MyChart) OR A paper copy in the mail If you have any lab test that is abnormal or we need to change your treatment, we will call you to review the results.  Testing/Procedures: none  Follow-Up: At [facility], you and your health needs are our priority.  As part of our continuing mission to provide you with exceptional heart care, our providers are all part of one team.  This team includes your primary Cardiologist (physician) and Advanced Practice Providers or APPs (Physician Assistants and Nurse Practitioners) who all work together to provide you with the care you need, when you need it.  Your next appointment:   As needed  Provider:   Yates Decamp, MD     We recommend signing up for the patient portal called "MyChart".  Sign up information is provided on this After Visit Summary.  MyChart is used to connect with patients for Virtual Visits (Telemedicine).  Patients are able to view lab/test results, encounter notes, upcoming appointments, etc.  Non-urgent messages can be sent to your provider as well.   To learn more about what you can do with MyChart, go to [email or web address].   Other Instructions       1st Floor: - Lobby - Registration  - Pharmacy  - Lab - Cafe  2nd Floor: - PV Lab - Diagnostic Testing (echo, CT, nuclear med)  3rd Floor: - Vacant  4th Floor: - TCTS (cardiothoracic surgery) - AFib Clinic - Structural Heart Clinic - Vascular Surgery  - Vascular Ultrasound  5th Floor: - HeartCare Cardiology  (general and EP) - Clinical Pharmacy for coumadin, hypertension, lipid, weight-loss medications, and med management appointments    Valet parking services will be available as well.

## 2024-03-29 ENCOUNTER — Other Ambulatory Visit: Payer: Self-pay | Admitting: Cardiology

## 2024-03-29 DIAGNOSIS — M940 Chondrocostal junction syndrome [Tietze]: Secondary | ICD-10-CM

## 2024-03-29 NOTE — Progress Notes (Signed)
 ICD-10-CM   1. Costochondritis  M94.0 CT CARDIAC SCORING (SELF PAY ONLY)        Knox Perl, MD, [facility] 03/29/2024, 6:37 PM [facility] [address] [place], [place] [address] Phone: [phone]. Fax:  [phone]

## 2024-04-18 ENCOUNTER — Ambulatory Visit (HOSPITAL_COMMUNITY)
Admission: RE | Admit: 2024-04-18 | Discharge: 2024-04-18 | Disposition: A | Discharge status: Home / Self Care | Payer: Self-pay | Source: Ambulatory Visit | Attending: Cardiology | Admitting: Cardiology

## 2024-04-18 ENCOUNTER — Encounter: Payer: Self-pay | Admitting: Cardiology

## 2024-04-18 DIAGNOSIS — M940 Chondrocostal junction syndrome [Tietze]: Secondary | ICD-10-CM | POA: Insufficient documentation

## 2024-04-18 NOTE — Progress Notes (Signed)
 Your coronary calcium score is 0.  No further cardiac evaluation is indicated.  There is minimal aortic valve calcification, clinically insignificant.  Greatly aortic valve calcification and individuals can happen if you have aortic valve disorder but your auscultation was completely normal.  If your PCP or any physician were to hear a murmur, please call us  so we can schedule him for an echocardiogram.   CHOL 129                        08/04/2023    HDL 62.00             08/04/2023   LDLCALC 60 08/04/2023   TRIG 37.0             08/04/2023   CHOLHDL 2 08/04/2023

## 2024-04-20 DIAGNOSIS — D225 Melanocytic nevi of trunk: Secondary | ICD-10-CM | POA: Diagnosis not present

## 2024-04-20 DIAGNOSIS — L821 Other seborrheic keratosis: Secondary | ICD-10-CM | POA: Diagnosis not present

## 2024-04-20 DIAGNOSIS — D2262 Melanocytic nevi of left upper limb, including shoulder: Secondary | ICD-10-CM | POA: Diagnosis not present

## 2024-04-20 DIAGNOSIS — D2261 Melanocytic nevi of right upper limb, including shoulder: Secondary | ICD-10-CM | POA: Diagnosis not present

## 2024-05-11 ENCOUNTER — Encounter: Payer: Self-pay | Admitting: Family Medicine

## 2024-05-11 ENCOUNTER — Telehealth: Payer: Self-pay | Admitting: Family Medicine

## 2024-05-11 NOTE — Telephone Encounter (Signed)
 Please process this PA

## 2024-05-11 NOTE — Telephone Encounter (Unsigned)
 Copied from CRM [id number]. Topic: Clinical - Medication Prior Auth >> May 11, 2024  2:31 PM Mario Moore wrote: Reason for CRM: Patient would like a prior authorization submit for temazepam  (RESTORIL ) 30 MG capsule. Pharmacy confirmed [id number] - [place] - [address] AT [facility] OF [address] & [facility] [address]

## 2024-05-12 ENCOUNTER — Telehealth: Payer: Self-pay

## 2024-05-12 ENCOUNTER — Other Ambulatory Visit (HOSPITAL_COMMUNITY): Payer: Self-pay

## 2024-05-12 ENCOUNTER — Telehealth: Payer: Self-pay | Admitting: Pharmacy Technician

## 2024-05-12 NOTE — Telephone Encounter (Signed)
 Pharmacy Patient Advocate Encounter   Received notification from Pt Calls Messages that prior authorization for Temazepam  30 caps is required/requested.   Insurance verification completed.   The patient is insured through [employer] .   Per test claim: Refill too soon. PA is not needed at this time. Medication was filled 02/11/24. Next eligible fill date is 02/11/35. Patient has max quantity limit of 30 capsules per 365 days.

## 2024-05-12 NOTE — Telephone Encounter (Signed)
 Pharmacy Patient Advocate Encounter   Received notification from CoverMyMeds that prior authorization for Temazepam  30MG  capsules is required/requested.   Insurance verification completed.   The patient is insured through [employer] .   Per test claim: PA required; PA started via CoverMyMeds. KEY [id number] . Waiting for clinical questions to populate.

## 2024-05-12 NOTE — Telephone Encounter (Signed)
 Please start a PA for this Rx

## 2024-05-18 NOTE — Telephone Encounter (Signed)
 Pharmacy Patient Advocate Encounter  Received notification from [facility] that Prior Authorization for Temazepam  30MG  capsules has been CANCELLED due to  [employer] for fax from insurance.**     PA #/Case ID/Reference #: [id number]

## 2024-05-19 ENCOUNTER — Other Ambulatory Visit (HOSPITAL_COMMUNITY): Payer: Self-pay

## 2024-06-03 ENCOUNTER — Other Ambulatory Visit (HOSPITAL_COMMUNITY): Payer: Self-pay

## 2024-06-16 ENCOUNTER — Other Ambulatory Visit: Payer: Self-pay | Admitting: Cardiology

## 2024-06-16 DIAGNOSIS — K219 Gastro-esophageal reflux disease without esophagitis: Secondary | ICD-10-CM

## 2024-06-20 ENCOUNTER — Ambulatory Visit: Admitting: Cardiovascular Disease

## 2024-07-14 ENCOUNTER — Encounter: Payer: Self-pay | Admitting: Family Medicine

## 2024-07-18 NOTE — Telephone Encounter (Signed)
 Advised pt to contact his insurance regarding having a CPE before recommended date. Pt will call the office back

## 2024-07-21 ENCOUNTER — Ambulatory Visit (INDEPENDENT_AMBULATORY_CARE_PROVIDER_SITE_OTHER): Admitting: Family Medicine

## 2024-07-21 VITALS — BP 98/60 | HR 51 | Temp 97.7°F | Ht 71.0 in | Wt 197.0 lb

## 2024-07-21 DIAGNOSIS — Z0000 Encounter for general adult medical examination without abnormal findings: Secondary | ICD-10-CM

## 2024-07-21 DIAGNOSIS — D509 Iron deficiency anemia, unspecified: Secondary | ICD-10-CM | POA: Diagnosis not present

## 2024-07-21 DIAGNOSIS — E039 Hypothyroidism, unspecified: Secondary | ICD-10-CM

## 2024-07-21 DIAGNOSIS — Z125 Encounter for screening for malignant neoplasm of prostate: Secondary | ICD-10-CM | POA: Diagnosis not present

## 2024-07-21 LAB — IBC + FERRITIN
Ferritin: 26.5 ng/mL (ref 22.0–322.0)
Iron: 69 ug/dL (ref 42–165)
Saturation Ratios: 16.9 % — ABNORMAL LOW (ref 20.0–50.0)
TIBC: 408.8 ug/dL (ref 250.0–450.0)
Transferrin: 292 mg/dL (ref 212.0–360.0)

## 2024-07-21 LAB — CBC WITH DIFFERENTIAL/PLATELET
Basophils Absolute: 0 K/uL (ref 0.0–0.1)
Basophils Relative: 1.3 % (ref 0.0–3.0)
Eosinophils Absolute: 0.2 K/uL (ref 0.0–0.7)
Eosinophils Relative: 5.9 % — ABNORMAL HIGH (ref 0.0–5.0)
HCT: 43.6 % (ref 39.0–52.0)
Hemoglobin: 14.6 g/dL (ref 13.0–17.0)
Lymphocytes Relative: 16.5 % (ref 12.0–46.0)
Lymphs Abs: 0.6 K/uL — ABNORMAL LOW (ref 0.7–4.0)
MCHC: 33.5 g/dL (ref 30.0–36.0)
MCV: 92 fl (ref 78.0–100.0)
Monocytes Absolute: 0.4 K/uL (ref 0.1–1.0)
Monocytes Relative: 12.3 % — ABNORMAL HIGH (ref 3.0–12.0)
Neutro Abs: 2.2 K/uL (ref 1.4–7.7)
Neutrophils Relative %: 64 % (ref 43.0–77.0)
Platelets: 192 K/uL (ref 150.0–400.0)
RBC: 4.74 Mil/uL (ref 4.22–5.81)
RDW: 14.3 % (ref 11.5–15.5)
WBC: 3.4 K/uL — ABNORMAL LOW (ref 4.0–10.5)

## 2024-07-21 LAB — HEPATIC FUNCTION PANEL
ALT: 59 U/L — ABNORMAL HIGH (ref 0–53)
AST: 46 U/L — ABNORMAL HIGH (ref 0–37)
Albumin: 4.3 g/dL (ref 3.5–5.2)
Alkaline Phosphatase: 92 U/L (ref 39–117)
Bilirubin, Direct: 0.1 mg/dL (ref 0.0–0.3)
Total Bilirubin: 0.6 mg/dL (ref 0.2–1.2)
Total Protein: 6.8 g/dL (ref 6.0–8.3)

## 2024-07-21 LAB — T4, FREE: Free T4: 0.92 ng/dL (ref 0.60–1.60)

## 2024-07-21 LAB — PSA: PSA: 0.42 ng/mL (ref 0.10–4.00)

## 2024-07-21 LAB — LIPID PANEL
Cholesterol: 170 mg/dL (ref 0–200)
HDL: 78.6 mg/dL (ref >39.00)
LDL Cholesterol: 81 mg/dL (ref 0–99)
NonHDL: 91.65
Total CHOL/HDL Ratio: 2
Triglycerides: 51 mg/dL (ref 0.0–149.0)
VLDL: 10.2 mg/dL (ref 0.0–40.0)

## 2024-07-21 LAB — BASIC METABOLIC PANEL WITH GFR
BUN: 15 mg/dL (ref 6–23)
CO2: 28 meq/L (ref 19–32)
Calcium: 9.1 mg/dL (ref 8.4–10.5)
Chloride: 103 meq/L (ref 96–112)
Creatinine, Ser: 0.97 mg/dL (ref 0.40–1.50)
GFR: 88.1 mL/min (ref >60.00)
Glucose, Bld: 92 mg/dL (ref 70–99)
Potassium: 4.2 meq/L (ref 3.5–5.1)
Sodium: 139 meq/L (ref 135–145)

## 2024-07-21 LAB — HEMOGLOBIN A1C: Hgb A1c MFr Bld: 5.3 % (ref 4.6–6.5)

## 2024-07-21 LAB — TSH: TSH: 4.62 u[IU]/mL (ref 0.35–5.50)

## 2024-07-21 LAB — T3, FREE: T3, Free: 5.2 pg/mL — ABNORMAL HIGH (ref 2.3–4.2)

## 2024-07-21 MED ORDER — TEMAZEPAM 30 MG PO CAPS: 30.0000 mg | ORAL_CAPSULE | Freq: Every day | ORAL | 1 refills | Status: AC

## 2024-07-21 MED ORDER — LEVOTHYROXINE SODIUM 75 MCG PO TABS: 75.0000 ug | ORAL_TABLET | Freq: Every day | ORAL | 3 refills | Status: AC

## 2024-07-21 NOTE — Telephone Encounter (Signed)
 Pt had his CPE this morning

## 2024-07-21 NOTE — Progress Notes (Signed)
 Subjective:    Patient ID: Mario Moore, male    DOB: October 11, 1971, 53 y.o.   MRN: [id number]  HPI Here for a well exam. He feels well. He has retired, and he and his wife will be moving to [place]  in a few weeks.    Review of Systems  Constitutional: Negative.   HENT: Negative.    Eyes: Negative.   Respiratory: Negative.    Cardiovascular: Negative.   Gastrointestinal: Negative.   Genitourinary: Negative.   Musculoskeletal: Negative.   Skin: Negative.   Neurological: Negative.   Psychiatric/Behavioral: Negative.         Objective:   Physical Exam Constitutional:      General: He is not in acute distress.    Appearance: Normal appearance. He is well-developed. He is not diaphoretic.  HENT:     Head: Normocephalic and atraumatic.     Right Ear: External ear normal.     Left Ear: External ear normal.     Nose: Nose normal.     Mouth/Throat:     Pharynx: No oropharyngeal exudate.  Eyes:     General: No scleral icterus.       Right eye: No discharge.        Left eye: No discharge.     Conjunctiva/sclera: Conjunctivae normal.     Pupils: Pupils are equal, round, and reactive to light.  Neck:     Thyroid : No thyromegaly.     Vascular: No JVD.     Trachea: No tracheal deviation.  Cardiovascular:     Rate and Rhythm: Normal rate and regular rhythm.     Pulses: Normal pulses.     Heart sounds: Normal heart sounds. No murmur heard.    No friction rub. No gallop.  Pulmonary:     Effort: Pulmonary effort is normal. No respiratory distress.     Breath sounds: Normal breath sounds. No wheezing or rales.  Chest:     Chest wall: No tenderness.  Abdominal:     General: Bowel sounds are normal. There is no distension.     Palpations: Abdomen is soft. There is no mass.     Tenderness: There is no abdominal tenderness. There is no guarding or rebound.  Genitourinary:    Penis: Normal. No tenderness.      Testes: Normal.     Prostate: Normal.     Rectum: Normal.  Guaiac result negative.  Musculoskeletal:        General: No tenderness. Normal range of motion.     Cervical back: Neck supple.  Lymphadenopathy:     Cervical: No cervical adenopathy.  Skin:    General: Skin is warm and dry.     Coloration: Skin is not pale.     Findings: No erythema or rash.  Neurological:     General: No focal deficit present.     Mental Status: He is alert and oriented to person, place, and time.     Cranial Nerves: No cranial nerve deficit.     Motor: No abnormal muscle tone.     Coordination: Coordination normal.     Deep Tendon Reflexes: Reflexes are normal and symmetric. Reflexes normal.  Psychiatric:        Mood and Affect: Mood normal.        Behavior: Behavior normal.        Thought Content: Thought content normal.        Judgment: Judgment normal.  Assessment & Plan:  Well exam. We discussed diet and exercise. Get fasting labs. Garnette Olmsted, MD

## 2024-07-22 ENCOUNTER — Ambulatory Visit: Payer: Self-pay | Admitting: Family Medicine

## 2024-07-22 NOTE — Addendum Note (Signed)
 Addended by: JOHNNY SENIOR A on: 07/22/2024 04:50 PM   Modules accepted: Orders

## 2024-08-03 ENCOUNTER — Telehealth: Payer: Self-pay

## 2024-08-03 ENCOUNTER — Other Ambulatory Visit (HOSPITAL_COMMUNITY): Payer: Self-pay

## 2024-08-03 NOTE — Telephone Encounter (Signed)
 Pharmacy Patient Advocate Encounter   Received notification from Pt Calls Messages that prior authorization for Temazepam  30MG  capsules  is required/requested.   Insurance verification completed.   The patient is insured through [facility] .   Per test claim: PA required and submitted KEY/EOC/Request #: [id number] from 08/03/24 to 08/03/25. Ran test claim, Copay is $8.04. This test claim was processed through [facility]- copay amounts may vary at other pharmacies due to pharmacy/plan contracts, or as the patient moves through the different stages of their insurance plan.

## 2024-08-03 NOTE — Telephone Encounter (Signed)
 Copied from CRM [id number]. Topic: Clinical - Medication Prior Auth >> Aug 03, 2024 11:01 AM Rea ORN wrote: Reason for CRM: Pt called to advise that his new pharmacy, [facility], need a prior auth for temazepam  (RESTORIL ) 30 MG capsule. Pt stated he had a prior auth for this medication done 20 days ago and is wondering if that is still active. Pt stated he has only has 2 days left and would like to get this filled quickly.

## 2024-08-04 ENCOUNTER — Telehealth: Payer: Self-pay

## 2024-08-04 NOTE — Telephone Encounter (Signed)
 Pharmacy Patient Advocate Encounter  Received notification from [facility] that Prior Authorization for Temazepam  30MG  capsules has been APPROVED from 07/04/24 to 08/03/25   PA #/Case ID/Reference #: [id number]

## 2024-08-09 NOTE — Telephone Encounter (Signed)
 Noted

## 2024-08-25 ENCOUNTER — Other Ambulatory Visit: Payer: Self-pay | Admitting: Family Medicine

## 2024-09-28 ENCOUNTER — Telehealth: Payer: Self-pay

## 2024-09-28 NOTE — Telephone Encounter (Signed)
 Patient called in stating he has moved to [place]  and will call back to reschedule lab and follow up visit with Dr. Tina. Patient lab and telephone visits for November 2025 have been cancelled per his request.

## 2024-10-17 ENCOUNTER — Inpatient Hospital Stay

## 2024-10-20 ENCOUNTER — Telehealth
